# Patient Record
Sex: Female | Born: 2000 | Hispanic: No | Marital: Married | Smoking: Never smoker
Health system: Southern US, Community
[De-identification: ages and names within clinical notes are randomized; demographics above are authoritative.]

## PROBLEM LIST (undated history)

## (undated) ENCOUNTER — Emergency Department (HOSPITAL_COMMUNITY): Admission: EM | Source: Home / Self Care

## (undated) DIAGNOSIS — O24419 Gestational diabetes mellitus in pregnancy, unspecified control: Secondary | ICD-10-CM

## (undated) DIAGNOSIS — K219 Gastro-esophageal reflux disease without esophagitis: Secondary | ICD-10-CM

## (undated) HISTORY — PX: NO PAST SURGERIES: SHX2092

---
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
## 2022-08-29 ENCOUNTER — Ambulatory Visit
Admission: EM | Admit: 2022-08-29 | Discharge: 2022-08-29 | Disposition: A | Discharge status: Home / Self Care | Payer: Medicaid Other | Attending: Urgent Care | Admitting: Urgent Care

## 2022-08-29 DIAGNOSIS — G4700 Insomnia, unspecified: Secondary | ICD-10-CM | POA: Diagnosis not present

## 2022-08-29 DIAGNOSIS — G4452 New daily persistent headache (NDPH): Secondary | ICD-10-CM | POA: Diagnosis not present

## 2022-08-29 DIAGNOSIS — R413 Other amnesia: Secondary | ICD-10-CM | POA: Diagnosis not present

## 2022-08-29 MED ORDER — HYDROXYZINE HCL 25 MG PO TABS: 12.5000 mg | ORAL_TABLET | Freq: Every evening | ORAL | 0 refills | Status: DC | PRN

## 2022-08-29 MED ORDER — NAPROXEN 375 MG PO TABS: 375.0000 mg | ORAL_TABLET | Freq: Two times a day (BID) | ORAL | 0 refills | Status: DC

## 2022-08-29 MED ORDER — KETOROLAC TROMETHAMINE 30 MG/ML IJ SOLN
30.0000 mg | Freq: Once | INTRAMUSCULAR | Status: AC
  Administered 2022-08-29: 30 mg via INTRAMUSCULAR

## 2022-08-29 NOTE — ED Provider Notes (Signed)
[facility]  Note:  This document was prepared using Systems analyst and may include unintentional dictation errors.  MRN: [id number] DOB: 12/17/2000  Subjective:   Anne Ritter is a 22 y.o. female presenting for 1 week history of posterior right-sided occipital headache.  No head injury, confusion, weakness, double vision, numbness or tingling, neck pain, neck stiffness, runny or stuffy nose, sore throat, cough, nausea, vomiting, abdominal pain.  No history of migraines or headaches that required workup with neurology practice.  Patient reports that for the past year she has had significant difficulty with her sleep, has generally gotten 4, 5 or at the most 6 hours of sleep at night.  She is in school now.  Does not go to work.  Denies any other chronic conditions.  No drug use, alcohol use.  No smoking.  No current facility-administered medications for this encounter.  Current Outpatient Medications:    albuterol (PROVENTIL HFA;VENTOLIN HFA) 108 (90 Base) MCG/ACT inhaler, Inhale 1-2 puffs into the lungs every 6 (six) hours as needed for wheezing or shortness of breath., Disp: 1 Inhaler, Rfl: 0   famotidine (PEPCID) 40 MG tablet, Take 0.5 tablets (20 mg total) by mouth 2 (two) times daily. (Patient not taking: Reported on 03/10/2016), Disp: 30 tablet, Rfl: 0   No Known Allergies  Past Medical History:  Diagnosis Date   GERD (gastroesophageal reflux disease)      History reviewed. No pertinent surgical history.  No family history on file.  Social History   Tobacco Use   Smoking status: Never   Smokeless tobacco: Never  Vaping Use   Vaping Use: Never used  Substance Use Topics   Alcohol use: No   Drug use: Never    ROS   Objective:   Vitals: BP 116/77 (BP Location: Right Arm)   Pulse 82   Temp 97.9 F (36.6 C) (Oral)   Resp 16   LMP 08/16/2022   SpO2 98%   Physical Exam Constitutional:      General: She is not in acute  distress.    Appearance: Normal appearance. She is well-developed and normal weight. She is not ill-appearing, toxic-appearing or diaphoretic.  HENT:     Head: Normocephalic and atraumatic.     Right Ear: Tympanic membrane, ear canal and external ear normal. No drainage or tenderness. No middle ear effusion. There is no impacted cerumen. Tympanic membrane is not erythematous or bulging.     Left Ear: Tympanic membrane, ear canal and external ear normal. No drainage or tenderness.  No middle ear effusion. There is no impacted cerumen. Tympanic membrane is not erythematous or bulging.     Nose: Nose normal. No congestion or rhinorrhea.     Mouth/Throat:     Mouth: Mucous membranes are moist. No oral lesions.     Pharynx: No pharyngeal swelling, oropharyngeal exudate, posterior oropharyngeal erythema or uvula swelling.     Tonsils: No tonsillar exudate or tonsillar abscesses.  Eyes:     General: No scleral icterus.       Right eye: No discharge.        Left eye: No discharge.     Extraocular Movements: Extraocular movements intact.     Right eye: Normal extraocular motion.     Left eye: Normal extraocular motion.     Conjunctiva/sclera: Conjunctivae normal.     Pupils: Pupils are equal, round, and reactive to light. Pupils are equal.  Neck:     Meningeal: Brudzinski's sign  and Kernig's sign absent.  Cardiovascular:     Rate and Rhythm: Normal rate.  Pulmonary:     Effort: Pulmonary effort is normal.  Musculoskeletal:     Cervical back: Normal range of motion and neck supple.  Lymphadenopathy:     Cervical: No cervical adenopathy.  Skin:    General: Skin is warm and dry.  Neurological:     General: No focal deficit present.     Mental Status: She is alert and oriented to person, place, and time.     Cranial Nerves: No cranial nerve deficit, dysarthria or facial asymmetry.     Motor: No weakness or pronator drift.     Coordination: Romberg sign negative. Coordination normal.  Finger-Nose-Finger Test and Heel to [facility] Test normal. Rapid alternating movements normal.     Gait: Gait and tandem walk normal.     Deep Tendon Reflexes: Reflexes normal.  Psychiatric:        Mood and Affect: Mood normal. Mood is not anxious or depressed.        Speech: Speech normal.        Behavior: Behavior normal. Behavior is not agitated.        Thought Content: Thought content normal.        Cognition and Memory: Cognition is not impaired. Memory is not impaired.        Judgment: Judgment normal.     Comments: Flat affect.     Assessment and Plan :   PDMP not reviewed this encounter.  1. New daily persistent headache   2. Memory difficulties   3. Insomnia, unspecified type     No signs of an acute encephalopathy.  Suspect that insomnia is the primary problem.  Recommended supportive care for now.  Will pursue basic lab test.  Provided her with information to [facility] urology that she wants to pursue further workup as she reports memory difficulties for the past few months.  Counseled patient on potential for adverse effects with medications prescribed/recommended today, ER and return-to-clinic precautions discussed, patient verbalized understanding.    Jaynee Eagles, PA-C 08/29/22 1236

## 2022-08-29 NOTE — ED Triage Notes (Signed)
Pt c/o HA x 1 week-denies dx migraine-taking tylenol with some relief-NAD-steady gait

## 2022-08-30 LAB — CBC
Hematocrit: 41.6 % (ref 34.0–46.6)
Hemoglobin: 13.1 g/dL (ref 11.1–15.9)
MCH: 28.1 pg (ref 26.6–33.0)
MCHC: 31.5 g/dL (ref 31.5–35.7)
MCV: 89 fL (ref 79–97)
Platelets: 423 10*3/uL (ref 150–450)
RBC: 4.67 x10E6/uL (ref 3.77–5.28)
RDW: 15 % (ref 11.7–15.4)
WBC: 5 10*3/uL (ref 3.4–10.8)

## 2022-08-30 LAB — BASIC METABOLIC PANEL
BUN/Creatinine Ratio: 13 (ref 9–23)
BUN: 9 mg/dL (ref 6–20)
CO2: 20 mmol/L (ref 20–29)
Calcium: 9.2 mg/dL (ref 8.7–10.2)
Chloride: 106 mmol/L (ref 96–106)
Creatinine, Ser: 0.7 mg/dL (ref 0.57–1.00)
Glucose: 88 mg/dL (ref 70–99)
Potassium: 4.6 mmol/L (ref 3.5–5.2)
Sodium: 140 mmol/L (ref 134–144)
eGFR: 127 mL/min/{1.73_m2} (ref >59)

## 2022-08-30 LAB — TSH: TSH: 1 u[IU]/mL (ref 0.450–4.500)

## 2022-09-28 ENCOUNTER — Ambulatory Visit
Admission: EM | Admit: 2022-09-28 | Discharge: 2022-09-28 | Disposition: A | Discharge status: Home / Self Care | Payer: Medicaid Other | Attending: Nurse Practitioner | Admitting: Nurse Practitioner

## 2022-09-28 ENCOUNTER — Ambulatory Visit (INDEPENDENT_AMBULATORY_CARE_PROVIDER_SITE_OTHER): Payer: Medicaid Other

## 2022-09-28 DIAGNOSIS — R051 Acute cough: Secondary | ICD-10-CM

## 2022-09-28 DIAGNOSIS — B9689 Other specified bacterial agents as the cause of diseases classified elsewhere: Secondary | ICD-10-CM

## 2022-09-28 DIAGNOSIS — J208 Acute bronchitis due to other specified organisms: Secondary | ICD-10-CM | POA: Diagnosis not present

## 2022-09-28 MED ORDER — AMOXICILLIN-POT CLAVULANATE 875-125 MG PO TABS: 1.0000 | ORAL_TABLET | Freq: Two times a day (BID) | ORAL | 0 refills | Status: DC

## 2022-09-28 MED ORDER — PROMETHAZINE-DM 6.25-15 MG/5ML PO SYRP: 5.0000 mL | ORAL_SOLUTION | Freq: Four times a day (QID) | ORAL | 0 refills | Status: DC | PRN

## 2022-09-28 NOTE — ED Provider Notes (Signed)
[facility]    CSN: [id number] Arrival date & time: 09/28/22  1419      History   Chief Complaint Chief Complaint  Patient presents with   Chest Pain    HPI Anne Ritter is a 22 y.o. female  presents for evaluation of URI symptoms for 10 days. Patient reports associated symptoms of dry cough, congestion, chest pain with coughing, intermittent dizziness, chills. Denies N/V/D, sore throat, fevers, shortness of breath, ear pain, body aches. Patient does not have a hx of asthma or smoking. No known sick contacts but patient works as a Pharmacist, hospital.  She states this feels like when she had pneumonia in the past.  Pt has taken Flonase, DayQuil, Tylenol OTC for symptoms. Pt has no other concerns at this time.    Chest Pain Associated symptoms: cough and dizziness     Past Medical History:  Diagnosis Date   GERD (gastroesophageal reflux disease)     There are no problems to display for this patient.   History reviewed. No pertinent surgical history.  OB History   No obstetric history on file.      Home Medications    Prior to Admission medications   Medication Sig Start Date End Date Taking? Authorizing Provider  amoxicillin-clavulanate (AUGMENTIN) 875-125 MG tablet Take 1 tablet by mouth every 12 (twelve) hours. 09/28/22  Yes Melynda Ripple, NP  promethazine-dextromethorphan (PROMETHAZINE-DM) 6.25-15 MG/5ML syrup Take 5 mLs by mouth 4 (four) times daily as needed for cough. 09/28/22  Yes Melynda Ripple, NP  albuterol (PROVENTIL HFA;VENTOLIN HFA) 108 (90 Base) MCG/ACT inhaler Inhale 1-2 puffs into the lungs every 6 (six) hours as needed for wheezing or shortness of breath. 03/24/18   Valarie Merino, MD  famotidine (PEPCID) 40 MG tablet Take 0.5 tablets (20 mg total) by mouth 2 (two) times daily. Patient not taking: Reported on 03/10/2016 09/15/15   Nona Dell, PA-C  hydrOXYzine (ATARAX) 25 MG tablet Take 0.5-1 tablets (12.5-25 mg total) by mouth at bedtime as  needed (insomnia). 08/29/22   Jaynee Eagles, PA-C  naproxen (NAPROSYN) 375 MG tablet Take 1 tablet (375 mg total) by mouth 2 (two) times daily with a meal. 08/29/22   Jaynee Eagles, PA-C    Family History History reviewed. No pertinent family history.  Social History Social History   Tobacco Use   Smoking status: Never   Smokeless tobacco: Never  Vaping Use   Vaping Use: Never used  Substance Use Topics   Alcohol use: No   Drug use: Never     Allergies   Patient has no known allergies.   Review of Systems Review of Systems  Constitutional:  Positive for chills.  HENT:  Positive for congestion.   Respiratory:  Positive for cough.        Chest pain with coughing  Neurological:  Positive for dizziness.     Physical Exam Triage Vital Signs ED Triage Vitals  Enc Vitals Group     BP 09/28/22 1438 110/75     Pulse Rate 09/28/22 1438 88     Resp 09/28/22 1438 18     Temp 09/28/22 1438 98.3 F (36.8 C)     Temp Source 09/28/22 1438 Oral     SpO2 09/28/22 1438 97 %     Weight --      Height --      Head Circumference --      Peak Flow --      Pain Score 09/28/22 1437  9     Pain Loc --      Pain Edu? --      Excl. in [place]? --    No data found.  Updated Vital Signs BP 110/75 (BP Location: Right Arm)   Pulse 88   Temp 98.3 F (36.8 C) (Oral)   Resp 18   LMP 08/19/2022 (Exact Date)   SpO2 97%   Visual Acuity Right Eye Distance:   Left Eye Distance:   Bilateral Distance:    Right Eye Near:   Left Eye Near:    Bilateral Near:     Physical Exam Vitals and nursing note reviewed.  Constitutional:      General: She is not in acute distress.    Appearance: She is well-developed. She is not ill-appearing.  HENT:     Head: Normocephalic and atraumatic.     Right Ear: Tympanic membrane and ear canal normal.     Left Ear: Tympanic membrane and ear canal normal.     Nose: Congestion present.     Mouth/Throat:     Mouth: Mucous membranes are moist.     Pharynx:  Oropharynx is clear. Uvula midline. No oropharyngeal exudate or posterior oropharyngeal erythema.     Tonsils: No tonsillar exudate or tonsillar abscesses.  Eyes:     Conjunctiva/sclera: Conjunctivae normal.     Pupils: Pupils are equal, round, and reactive to light.  Cardiovascular:     Rate and Rhythm: Normal rate and regular rhythm.     Heart sounds: Normal heart sounds.  Pulmonary:     Effort: Pulmonary effort is normal.     Breath sounds: Normal breath sounds.  Musculoskeletal:     Cervical back: Normal range of motion and neck supple.  Lymphadenopathy:     Cervical: No cervical adenopathy.  Skin:    General: Skin is warm and dry.  Neurological:     General: No focal deficit present.     Mental Status: She is alert and oriented to person, place, and time.  Psychiatric:        Mood and Affect: Mood normal.        Behavior: Behavior normal.      UC Treatments / Results  Labs (all labs ordered are listed, but only abnormal results are displayed) Labs Reviewed - No data to display  EKG   Radiology DG Chest 2 View  Result Date: 09/28/2022 CLINICAL DATA:  Cough for 1 week, chest pain, history of pneumonia EXAM: CHEST - 2 VIEW COMPARISON:  03/24/2018 FINDINGS: Normal heart size, mediastinal contours, and pulmonary vascularity. Mild peribronchial thickening. No pulmonary infiltrate, pleural effusion, or pneumothorax. Bones unremarkable. IMPRESSION: Mild chronic bronchitic changes without infiltrate. Electronically Signed   By: Lavonia Dana M.D.   On: 09/28/2022 15:06    Procedures Procedures (including critical care time)  Medications Ordered in UC Medications - No data to display  Initial Impression / Assessment and Plan / UC Course  I have reviewed the triage vital signs and the nursing notes.  Pertinent labs & imaging results that were available during my care of the patient were reviewed by me and considered in my medical decision making (see chart for details).      Reviewed symptoms and x-ray with patient.  No pneumonia. Start Augmentin given length of symptoms Promethazine DM as needed for cough.  Side effect profile reviewed Rest and fluids Continue OTC Tylenol or ibuprofen as needed Follow-up with PCP if symptoms do not improve ER precautions reviewed and  patient verbalized understanding Final Clinical Impressions(s) / UC Diagnoses   Final diagnoses:  Acute cough  Acute bacterial bronchitis     Discharge Instructions      Augmentin twice daily for 7 days Promethazine DM as needed for cough.  Please note this medication can make you drowsy.  Do not drink alcohol or drive on this medication Rest and fluids Continue over-the-counter Tylenol or ibuprofen as needed Follow-up with your PCP if your symptoms do not improve Please go to the emergency room for any worsening symptoms     ED Prescriptions     Medication Sig Dispense Auth. Provider   amoxicillin-clavulanate (AUGMENTIN) 875-125 MG tablet Take 1 tablet by mouth every 12 (twelve) hours. 14 tablet Melynda Ripple, NP   promethazine-dextromethorphan (PROMETHAZINE-DM) 6.25-15 MG/5ML syrup Take 5 mLs by mouth 4 (four) times daily as needed for cough. 118 mL Melynda Ripple, NP      PDMP not reviewed this encounter.   Melynda Ripple, NP 09/28/22 1517

## 2022-09-28 NOTE — Discharge Instructions (Signed)
Augmentin twice daily for 7 days Promethazine DM as needed for cough.  Please note this medication can make you drowsy.  Do not drink alcohol or drive on this medication Rest and fluids Continue over-the-counter Tylenol or ibuprofen as needed Follow-up with your PCP if your symptoms do not improve Please go to the emergency room for any worsening symptoms

## 2022-09-28 NOTE — ED Triage Notes (Addendum)
Pt states Cold sxs started on Monday last week and then Thursday c/o pain in throat, cough, cold sxs and HA on Monday. Starting yesterday with c/o burning pain in middle of chest, hard to breath, dizziness/lightheaded yesterday at work. No F/chills. Hx of PNA. Home remedies: Dayquil, Tylenol, Fluticasone

## 2022-10-06 ENCOUNTER — Ambulatory Visit
Admission: EM | Admit: 2022-10-06 | Discharge: 2022-10-06 | Disposition: A | Discharge status: Home / Self Care | Payer: Medicaid Other | Attending: Physician Assistant | Admitting: Physician Assistant

## 2022-10-06 DIAGNOSIS — B3731 Acute candidiasis of vulva and vagina: Secondary | ICD-10-CM | POA: Insufficient documentation

## 2022-10-06 MED ORDER — FLUCONAZOLE 150 MG PO TABS: 150.0000 mg | ORAL_TABLET | Freq: Every day | ORAL | 1 refills | Status: DC

## 2022-10-06 NOTE — ED Triage Notes (Signed)
Pt presents with c/o vaginal irritation X 3 days.  States she wants to make sure she also does not have bronchitis anymore.

## 2022-10-06 NOTE — ED Provider Notes (Signed)
[facility]    CSN: [id number] Arrival date & time: 10/06/22  1229      History   Chief Complaint Chief Complaint  Patient presents with   vaginal irritation    HPI Anne Ritter is a 22 y.o. female.   Patient complains of a vaginal discharge.  Patient just finished Augmentin for a infection.  Patient is concerned that she has a yeast infection.  Patient denies any STD risk patient has not had any urinary symptoms.  Patient reports that she has a cough.  Patient denies any difficulty breathing she states that she feels better after finishing the antibiotic  The history is provided by the patient. No language interpreter was used.    Past Medical History:  Diagnosis Date   GERD (gastroesophageal reflux disease)     There are no problems to display for this patient.   History reviewed. No pertinent surgical history.  OB History   No obstetric history on file.      Home Medications    Prior to Admission medications   Medication Sig Start Date End Date Taking? Authorizing Provider  fluconazole (DIFLUCAN) 150 MG tablet Take 1 tablet (150 mg total) by mouth daily. 10/06/22  Yes Caryl Ada K, PA-C  albuterol (PROVENTIL HFA;VENTOLIN HFA) 108 (90 Base) MCG/ACT inhaler Inhale 1-2 puffs into the lungs every 6 (six) hours as needed for wheezing or shortness of breath. 03/24/18   Valarie Merino, MD  amoxicillin-clavulanate (AUGMENTIN) 875-125 MG tablet Take 1 tablet by mouth every 12 (twelve) hours. 09/28/22   Melynda Ripple, NP  famotidine (PEPCID) 40 MG tablet Take 0.5 tablets (20 mg total) by mouth 2 (two) times daily. Patient not taking: Reported on 03/10/2016 09/15/15   Nona Dell, PA-C  hydrOXYzine (ATARAX) 25 MG tablet Take 0.5-1 tablets (12.5-25 mg total) by mouth at bedtime as needed (insomnia). 08/29/22   Jaynee Eagles, PA-C  naproxen (NAPROSYN) 375 MG tablet Take 1 tablet (375 mg total) by mouth 2 (two) times daily with a meal. 08/29/22   Jaynee Eagles,  PA-C  promethazine-dextromethorphan (PROMETHAZINE-DM) 6.25-15 MG/5ML syrup Take 5 mLs by mouth 4 (four) times daily as needed for cough. 09/28/22   Melynda Ripple, NP    Family History History reviewed. No pertinent family history.  Social History Social History   Tobacco Use   Smoking status: Never   Smokeless tobacco: Never  Vaping Use   Vaping Use: Never used  Substance Use Topics   Alcohol use: No   Drug use: Never     Allergies   Patient has no known allergies.   Review of Systems Review of Systems  All other systems reviewed and are negative.    Physical Exam Triage Vital Signs ED Triage Vitals [10/06/22 1255]  Enc Vitals Group     BP 110/78     Pulse Rate 85     Resp 18     Temp 98.2 F (36.8 C)     Temp Source Oral     SpO2 98 %     Weight      Height      Head Circumference      Peak Flow      Pain Score 5     Pain Loc      Pain Edu?      Excl. in [place]?    No data found.  Updated Vital Signs BP 110/78 (BP Location: Right Arm)   Pulse 85   Temp 98.2  F (36.8 C) (Oral)   Resp 18   LMP 09/17/2022 (Exact Date)   SpO2 98%   Visual Acuity Right Eye Distance:   Left Eye Distance:   Bilateral Distance:    Right Eye Near:   Left Eye Near:    Bilateral Near:     Physical Exam Vitals reviewed.  Constitutional:      Appearance: Normal appearance.  HENT:     Mouth/Throat:     Mouth: Mucous membranes are moist.  Cardiovascular:     Rate and Rhythm: Normal rate and regular rhythm.  Pulmonary:     Effort: Pulmonary effort is normal.  Abdominal:     General: Abdomen is flat.  Musculoskeletal:        General: Normal range of motion.     Cervical back: Normal range of motion.  Skin:    General: Skin is warm.  Neurological:     General: No focal deficit present.     Mental Status: She is alert.  Psychiatric:        Mood and Affect: Mood normal.      UC Treatments / Results  Labs (all labs ordered are listed, but only abnormal  results are displayed) Labs Reviewed  CERVICOVAGINAL ANCILLARY ONLY    EKG   Radiology No results found.  Procedures Procedures (including critical care time)  Medications Ordered in UC Medications - No data to display  Initial Impression / Assessment and Plan / UC Course  I have reviewed the triage vital signs and the nursing notes.  Pertinent labs & imaging results that were available during my care of the patient were reviewed by me and considered in my medical decision making (see chart for details).     Patient has vaginal discharge after taking Augmentin for a week I suspect she has a yeast infection patient is given a prescription for Diflucan Final Clinical Impressions(s) / UC Diagnoses   Final diagnoses:  None   Discharge Instructions   None    ED Prescriptions     Medication Sig Dispense Auth. Provider   fluconazole (DIFLUCAN) 150 MG tablet Take 1 tablet (150 mg total) by mouth daily. 1 tablet Fransico Meadow, [place]      An After Visit Summary was printed and given to the patient.  PDMP not reviewed this encounter.   Fransico Meadow, [place] 10/06/22 1338

## 2022-10-08 ENCOUNTER — Telehealth: Payer: Self-pay

## 2022-10-08 NOTE — Telephone Encounter (Signed)
Patient verification complete (name and date of birth).  Patient requesting to know resent cytology results. Patient made aware that the results are still pending. All questions answered.

## 2022-10-09 ENCOUNTER — Telehealth: Payer: Self-pay | Admitting: Emergency Medicine

## 2022-10-09 LAB — CERVICOVAGINAL ANCILLARY ONLY
Bacterial Vaginitis (gardnerella): POSITIVE — AB
Candida Glabrata: NEGATIVE
Candida Vaginitis: NEGATIVE
Chlamydia: NEGATIVE
Comment: NEGATIVE
Comment: NEGATIVE
Comment: NEGATIVE
Comment: NEGATIVE
Comment: NEGATIVE
Comment: NORMAL
Neisseria Gonorrhea: NEGATIVE
Trichomonas: NEGATIVE

## 2022-10-09 MED ORDER — METRONIDAZOLE 500 MG PO TABS: 500.0000 mg | ORAL_TABLET | Freq: Two times a day (BID) | ORAL | 0 refills | Status: DC

## 2022-10-22 ENCOUNTER — Telehealth: Payer: Self-pay | Admitting: *Deleted

## 2022-10-22 NOTE — Telephone Encounter (Signed)
Pt reports that another facility had prescribed her oral contraceptives, and she was told to start them on the first Sunday following the start date of her menstrual period; pt states she started her period this past week "but it was very short" compared to normal and is wondering if she should still start the oral contraceptives today. Informed pt that we cannot provide advice to treatment we haven't prescribed, and recommended that pt contact prescriber. Pt verbalized understanding.

## 2022-11-09 ENCOUNTER — Ambulatory Visit
Admission: EM | Admit: 2022-11-09 | Discharge: 2022-11-09 | Disposition: A | Discharge status: Home / Self Care | Payer: Medicaid Other | Attending: Nurse Practitioner | Admitting: Nurse Practitioner

## 2022-11-09 DIAGNOSIS — R21 Rash and other nonspecific skin eruption: Secondary | ICD-10-CM | POA: Diagnosis not present

## 2022-11-09 MED ORDER — TRIAMCINOLONE ACETONIDE 0.1 % EX CREA: 1.0000 | TOPICAL_CREAM | Freq: Two times a day (BID) | CUTANEOUS | 0 refills | Status: DC

## 2022-11-09 NOTE — ED Provider Notes (Signed)
[facility]    CSN: [id number] Arrival date & time: 11/09/22  1254      History   Chief Complaint Chief Complaint  Patient presents with   Rash    HPI Anne Ritter is a 22 y.o. female presents for evaluation of a rash.  Patient reports since February she has had progressively worsening mildly pruritic rash on the inner aspects of both upper arms as well as on her inner thighs.  She states it feels tender to touch but otherwise does not cause her any pain.  Denies any swelling, drainage, fevers, chills.  No new contacts including soaps, medications, pets, etc.  She states she has had eczema in the past on her face only but this is not consistent with her typical eczema.  She states a reasonable stretch marks so she tried an over-the-counter stretch mark cream and feels like it made it worse.  She denies any weight gain since onset of symptoms.  No other concerns at this time.   Rash   Past Medical History:  Diagnosis Date   GERD (gastroesophageal reflux disease)     There are no problems to display for this patient.   History reviewed. No pertinent surgical history.  OB History   No obstetric history on file.      Home Medications    Prior to Admission medications   Medication Sig Start Date End Date Taking? Authorizing Provider  triamcinolone cream (KENALOG) 0.1 % Apply 1 Application topically 2 (two) times daily. 11/09/22  Yes Radford Pax, NP  albuterol (PROVENTIL HFA;VENTOLIN HFA) 108 (90 Base) MCG/ACT inhaler Inhale 1-2 puffs into the lungs every 6 (six) hours as needed for wheezing or shortness of breath. 03/24/18   Wynetta Fines, MD    Family History History reviewed. No pertinent family history.  Social History Social History   Tobacco Use   Smoking status: Never   Smokeless tobacco: Never  Vaping Use   Vaping Use: Never used  Substance Use Topics   Alcohol use: No   Drug use: Never     Allergies   Patient has no known  allergies.   Review of Systems Review of Systems  Skin:  Positive for rash.     Physical Exam Triage Vital Signs ED Triage Vitals  Enc Vitals Group     BP 11/09/22 1315 115/79     Pulse Rate 11/09/22 1315 93     Resp 11/09/22 1315 18     Temp 11/09/22 1315 98.8 F (37.1 C)     Temp Source 11/09/22 1315 Oral     SpO2 11/09/22 1315 99 %     Weight --      Height --      Head Circumference --      Peak Flow --      Pain Score 11/09/22 1316 0     Pain Loc --      Pain Edu? --      Excl. in GC? --    No data found.  Updated Vital Signs BP 115/79 (BP Location: Left Arm)   Pulse 93   Temp 98.8 F (37.1 C) (Oral)   Resp 18   LMP 10/17/2022   SpO2 99%   Visual Acuity Right Eye Distance:   Left Eye Distance:   Bilateral Distance:    Right Eye Near:   Left Eye Near:    Bilateral Near:     Physical Exam Vitals and nursing note reviewed.  Constitutional:  General: She is not in acute distress.    Appearance: Normal appearance. She is not ill-appearing.  HENT:     Head: Normocephalic and atraumatic.  Eyes:     Pupils: Pupils are equal, round, and reactive to light.  Cardiovascular:     Rate and Rhythm: Normal rate.  Pulmonary:     Effort: Pulmonary effort is normal.  Skin:    General: Skin is warm and dry.     Comments: Mildly erythematic striations noted on the inner aspects of bilateral upper arms and inner thighs.  No swelling drainage vesicles lesions.  Mildly tender to palpation.  No warmth.  Please see photo  Neurological:     General: No focal deficit present.     Mental Status: She is alert and oriented to person, place, and time.  Psychiatric:        Mood and Affect: Mood normal.        Behavior: Behavior normal.      UC Treatments / Results  Labs (all labs ordered are listed, but only abnormal results are displayed) Labs Reviewed - No data to display Recent Results (from the past 2160 hour(s))  TSH     Status: None   Collection Time:  08/29/22 11:50 AM  Result Value Ref Range   TSH 1.000 0.450 - 4.500 uIU/mL  Basic metabolic panel     Status: None   Collection Time: 08/29/22 11:50 AM  Result Value Ref Range   Glucose 88 70 - 99 mg/dL   BUN 9 6 - 20 mg/dL   Creatinine, Ser 1.30 0.57 - 1.00 mg/dL   eGFR 865 >78 IO/NGE/9.52   BUN/Creatinine Ratio 13 9 - 23   Sodium 140 134 - 144 mmol/L   Potassium 4.6 3.5 - 5.2 mmol/L   Chloride 106 96 - 106 mmol/L   CO2 20 20 - 29 mmol/L   Calcium 9.2 8.7 - 10.2 mg/dL  CBC     Status: None   Collection Time: 08/29/22 11:50 AM  Result Value Ref Range   WBC 5.0 3.4 - 10.8 x10E3/uL   RBC 4.67 3.77 - 5.28 x10E6/uL   Hemoglobin 13.1 11.1 - 15.9 g/dL   Hematocrit 84.1 32.4 - 46.6 %   MCV 89 79 - 97 fL   MCH 28.1 26.6 - 33.0 pg   MCHC 31.5 31.5 - 35.7 g/dL   RDW 40.1 02.7 - 25.3 %   Platelets 423 150 - 450 x10E3/uL  Cervicovaginal ancillary only     Status: Abnormal   Collection Time: 10/06/22  1:28 PM  Result Value Ref Range   Bacterial Vaginitis (gardnerella) Positive (A)    Chlamydia Negative    Neisseria Gonorrhea Negative    Candida Vaginitis Negative    Candida Glabrata Negative    Trichomonas Negative    Comment Normal Reference Ranger Chlamydia - Negative    Comment      Normal Reference Range Bacterial Vaginosis - Negative   Comment      Normal Reference Range Neisseria Gonorrhea - Negative   Comment Normal Reference Range Candida Species - Negative    Comment Normal Reference Range Candida Galbrata - Negative    Comment Normal Reference Range Trichomonas - Negative      EKG   Radiology No results found.  Procedures Procedures (including critical care time)  Medications Ordered in UC Medications - No data to display  Initial Impression / Assessment and Plan / UC Course  I have reviewed the triage vital signs  and the nursing notes.  Pertinent labs & imaging results that were available during my care of the patient were reviewed by me and considered in  my medical decision making (see chart for details).    Reviewed recent provider visits and labs.  Unclear cause of rash.  Does not appear infectious but not typical for dermatitis Will do trial of Kenalog cream and referred to dermatology for further evaluation Patient should follow-up with PCP if symptoms do not improve ER precautions reviewed and patient verbalized understanding Final Clinical Impressions(s) / UC Diagnoses   Final diagnoses:  Rash and nonspecific skin eruption     Discharge Instructions      Will do trial of topical steroid cream Kenalog to the rash areas twice daily as needed Please follow-up with dermatology for further evaluation of your symptoms   ED Prescriptions     Medication Sig Dispense Auth. Provider   triamcinolone cream (KENALOG) 0.1 % Apply 1 Application topically 2 (two) times daily. 30 g Radford Pax, NP      PDMP not reviewed this encounter.   Radford Pax, NP 11/09/22 1331

## 2022-11-09 NOTE — Discharge Instructions (Signed)
Will do trial of topical steroid cream Kenalog to the rash areas twice daily as needed Please follow-up with dermatology for further evaluation of your symptoms

## 2022-11-09 NOTE — ED Triage Notes (Signed)
Pt c/o a red burning rash to bilateral upper anterior arms and thighs since February. States looks like stretch marks but hasn't had any weight change and used cream that made it was.

## 2022-11-15 ENCOUNTER — Ambulatory Visit (INDEPENDENT_AMBULATORY_CARE_PROVIDER_SITE_OTHER): Payer: Medicaid Other | Admitting: Dermatology

## 2022-11-15 ENCOUNTER — Encounter: Payer: Self-pay | Admitting: Dermatology

## 2022-11-15 DIAGNOSIS — L299 Pruritus, unspecified: Secondary | ICD-10-CM | POA: Diagnosis not present

## 2022-11-15 DIAGNOSIS — L906 Striae atrophicae: Secondary | ICD-10-CM

## 2022-11-15 MED ORDER — TRETINOIN 0.025 % EX CREA: TOPICAL_CREAM | Freq: Every day | CUTANEOUS | 0 refills | Status: DC

## 2022-11-15 MED ORDER — TACROLIMUS 0.1 % EX OINT: TOPICAL_OINTMENT | Freq: Two times a day (BID) | CUTANEOUS | 0 refills | Status: DC

## 2022-11-15 NOTE — Progress Notes (Unsigned)
   New Patient Visit   Subjective  Anne Ritter is a 22 y.o. female who presents for the following: Rash  Patient has a rash on the medial arms and thighs. It has been present since February. It has a burning sensation especially before bed and showering. Has tried Triamcinolone given by Urgent Care. She has been using it for about a week with relieve in redness but not the burning sensation. Not using any other products. Uses regular sensitive bar soap and moisturized with Vaseline and EOS lotion.    The following portions of the chart were reviewed this encounter and updated as appropriate: medications, allergies, medical history  Review of Systems:  No other skin or systemic complaints except as noted in HPI or Assessment and Plan.  Objective  Well appearing patient in no apparent distress; mood and affect are within normal limits.  A focused examination was performed of the following areas: Arms and Legs   Relevant exam findings are noted in the Assessment and Plan.    Assessment & Plan  Striae Rubra  Exam: Red, itchy marks due to stretching on the skin  Treatment Plan: -Tretinoin 0.025% to use at night. Apply a pea sized amount of prescription mixed with lotion. Apply to affected areas.    Puritis Exam: Irritated itchy skin  Treatment Plan: -Tacrolimus to use twice a day for two weeks    Return in about 4 months (around 03/18/2023) for Striae follow up.    Documentation: I have reviewed the above documentation for accuracy and completeness, and I agree with the above.  Langston Reusing, MD  I, Germaine Pomfret, CMA, am acting as scribe for Langston Reusing, MD.

## 2022-11-15 NOTE — Patient Instructions (Addendum)
Due to recent changes in healthcare laws, you may see results of your pathology and/or laboratory studies on MyChart before the doctors have had a chance to review them. We understand that in some cases there may be results that are confusing or concerning to you. Please understand that not all results are received at the same time and often the doctors may need to interpret multiple results in order to provide you with the best plan of care or course of treatment. Therefore, we ask that you please give us 2 business days to thoroughly review all your results before contacting the office for clarification. Should we see a critical lab result, you will be contacted sooner.   If You Need Anything After Your Visit  If you have any questions or concerns for your doctor, please call our main line at [phone] If no one answers, please leave a voicemail as directed and we will return your call as soon as possible. Messages left after 4 pm will be answered the following business day.   You may also send us a message via MyChart. We typically respond to MyChart messages within 1-2 business days.  For prescription refills, please ask your pharmacy to contact our office. Our fax number is [phone].  If you have an urgent issue when the clinic is closed that cannot wait until the next business day, you can page your doctor at the number below.    Please note that while we do our best to be available for urgent issues outside of office hours, we are not available 24/7.   If you have an urgent issue and are unable to reach us, you may choose to seek medical care at your doctor's office, retail clinic, urgent care center, or emergency room.  If you have a medical emergency, please immediately call 911 or go to the emergency department. In the event of inclement weather, please call our main line at [phone] for an update on the status of any delays or closures.  Dermatology Medication Tips: Please  keep the boxes that topical medications come in in order to help keep track of the instructions about where and how to use these. Pharmacies typically print the medication instructions only on the boxes and not directly on the medication tubes.   If your medication is too expensive, please contact our office at [phone] or send us a message through MyChart.   We are unable to tell what your co-pay for medications will be in advance as this is different depending on your insurance coverage. However, we may be able to find a substitute medication at lower cost or fill out paperwork to get insurance to cover a needed medication.   If a prior authorization is required to get your medication covered by your insurance company, please allow us 1-2 business days to complete this process.  Drug prices often vary depending on where the prescription is filled and some pharmacies may offer cheaper prices.  The website www.goodrx.com contains coupons for medications through different pharmacies. The prices here do not account for what the cost may be with help from insurance (it may be cheaper with your insurance), but the website can give you the price if you did not use any insurance.  - You can print the associated coupon and take it with your prescription to the pharmacy.  - You may also stop by our office during regular business hours and pick up a GoodRx coupon card.  - If you need your   prescription sent electronically to a different pharmacy, notify our office through [facility] MyChart or by phone at [phone]     

## 2022-11-16 ENCOUNTER — Other Ambulatory Visit: Payer: Self-pay

## 2022-11-16 MED ORDER — TRETINOIN 0.025 % EX CREA: TOPICAL_CREAM | Freq: Every day | CUTANEOUS | 0 refills | Status: DC

## 2023-05-21 ENCOUNTER — Emergency Department (HOSPITAL_BASED_OUTPATIENT_CLINIC_OR_DEPARTMENT_OTHER)
Admission: EM | Admit: 2023-05-21 | Discharge: 2023-05-21 | Disposition: A | Discharge status: Home / Self Care | Payer: Medicaid Other | Attending: Emergency Medicine | Admitting: Emergency Medicine

## 2023-05-21 ENCOUNTER — Other Ambulatory Visit: Payer: Self-pay

## 2023-05-21 ENCOUNTER — Encounter (HOSPITAL_BASED_OUTPATIENT_CLINIC_OR_DEPARTMENT_OTHER): Payer: Self-pay | Admitting: Pediatrics

## 2023-05-21 DIAGNOSIS — O26891 Other specified pregnancy related conditions, first trimester: Secondary | ICD-10-CM | POA: Insufficient documentation

## 2023-05-21 DIAGNOSIS — R202 Paresthesia of skin: Secondary | ICD-10-CM | POA: Insufficient documentation

## 2023-05-21 DIAGNOSIS — O99351 Diseases of the nervous system complicating pregnancy, first trimester: Secondary | ICD-10-CM | POA: Diagnosis present

## 2023-05-21 DIAGNOSIS — Z3A14 14 weeks gestation of pregnancy: Secondary | ICD-10-CM | POA: Insufficient documentation

## 2023-05-21 NOTE — ED Provider Notes (Signed)
[facility] Provider Note   CSN: [id number] Arrival date & time: 05/21/23  1035     History  Chief Complaint  Patient presents with   Bleeding/Bruising    Anne Ritter is a 22 y.o. female who is [redacted] weeks pregnant and presents emergency department for evaluation of bruising on her right AC surface along with numbness and paresthesias of her entire arm last night.  Patient reported having a blood draw on 10/29 where she experienced pain during the blood draw and noticed swelling and bruising that persisted afterwards.  She tried using warm compresses which did not help.  Patient reports improvement in the swelling and bruising.  Patient denies history of bleeding disorders.  She stated that last night while she was sleeping she woke up and her entire right arm was numb with paresthesias.  She stated that this kept her awake throughout the night.  She denied facial numbness, weakness of RUE, or dizziness.  She stated that when she woke up this morning her symptoms completely resolved.     Home Medications Prior to Admission medications   Medication Sig Start Date End Date Taking? Authorizing Provider  albuterol (PROVENTIL HFA;VENTOLIN HFA) 108 (90 Base) MCG/ACT inhaler Inhale 1-2 puffs into the lungs every 6 (six) hours as needed for wheezing or shortness of breath. 03/24/18   Wynetta Fines, MD  tacrolimus (PROTOPIC) 0.1 % ointment Apply topically 2 (two) times daily. Apply topically to the affected areas twice a day for two weeks then as needed for itch 11/15/22   Terri Piedra, DO  tretinoin (RETIN-A) 0.025 % cream Apply topically at bedtime. Mix a pea sized amount with quarter size amount of moisturizer. Apply to affected areas every other night 11/16/22 11/16/23  Terri Piedra, DO  triamcinolone cream (KENALOG) 0.1 % Apply 1 Application topically 2 (two) times daily. 11/09/22   Radford Pax, NP      Allergies    Patient has no known  allergies.    Review of Systems   Review of Systems  Neurological:  Positive for numbness (Resolved). Negative for dizziness, weakness and headaches.  Hematological:  Does not bruise/bleed easily.    Physical Exam Updated Vital Signs BP 108/67   Pulse 88   Temp 97.9 F (36.6 C) (Oral)   Resp 15   Ht 5\' 4"  (1.626 m)   Wt 60.3 kg   LMP 02/10/2023   SpO2 100%   BMI 22.83 kg/m  Physical Exam Vitals and nursing note reviewed.  Constitutional:      General: She is not in acute distress.    Appearance: She is normal weight. She is not ill-appearing, toxic-appearing or diaphoretic.  Musculoskeletal:        General: Normal range of motion.     Right shoulder: No swelling. Normal range of motion.     Left shoulder: No swelling. Normal range of motion.     Comments: Right AC bruising  Skin:    General: Skin is warm and dry.     Findings: Bruising (Right AC) present.  Neurological:     General: No focal deficit present.     Mental Status: She is alert.     Cranial Nerves: No dysarthria or facial asymmetry.     Sensory: Sensation is intact.     Motor: Motor function is intact. No weakness.     ED Results / Procedures / Treatments   Labs (all labs ordered are listed, but  only abnormal results are displayed) Labs Reviewed - No data to display  EKG None  Radiology No results found.  Procedures Procedures    Medications Ordered in ED Medications - No data to display  ED Course/ Medical Decision Making/ A&P                                 Medical Decision Making Patient is a 22 year old female who is [redacted] weeks pregnant who presents to the emergency department for right AC bruising and right upper extremity numbness with paresthesias that have resolved. Physical exam was remarkable for right AC bruising without edema or tenderness.  Sensation was intact on the bilateral upper extremities, motor strength 5 out of 5 bilateral upper extremities, EOM intact, no facial droop  or dysarthria appreciated on exam.  Paresthesias most likely due to sleeping on her arm in a different position which would have caused numbness and paresthesias in a nonspecific dermatome distribution.  Other etiologies such as TIA is considered however she is low risk with the ABCD 2 Score. The right AC bruising is improving.    She was discharged home with resources to establish with a PCP.            Final Clinical Impression(s) / ED Diagnoses Final diagnoses:  Paresthesias    Rx / DC Orders ED Discharge Orders     None         Faith Rogue, DO 05/21/23 1233    Laurence Spates, MD 05/21/23 1943

## 2023-05-21 NOTE — ED Triage Notes (Signed)
States she is [redacted] weeks pregnant, G1P0; stated she had a glucose tolerance test on 10/29 and the bruising from venipuncture on left arm / AC area has a bruise that's getting bigger along with episode of numbness all night and improved upon awakening, stated initially, the site was swollen but it has improved.

## 2023-05-21 NOTE — ED Notes (Signed)
D/c paperwork reviewed with pt, including follow up care.  All questions and/or concerns addressed at time of d/c.  No further needs expressed. . Pt verbalized understanding, Ambulatory without assistance to ED exit, NAD.   

## 2023-05-21 NOTE — Discharge Instructions (Signed)
You came into the emergency department for concerns of bruising on the arm and numbness of your arm. The bruising is improving and the numbness has resolved, this could be due to sleeping on your arm. Please call to make an appointment with the PCP listed above.

## 2023-07-18 NOTE — L&D Delivery Note (Signed)
    Anne Ritter is a 23 y.o. G1P0 s/p VD at [redacted]w[redacted]d. She was admitted for SROM with active labor.   ROM: 8h 27m with clear fluid GBS Status: Negative Maximum Maternal Temperature: 98.2  Labor Progress: Patient presented for SROM and labored without need for augmentation.  Patient delivered as below with staff, mother, and doula support.   Delivery Date/Time: Saturday November 11, 2023 at 0345 Delivery: Called to room and patient was complete and pushing. After one hour of pushing the head delivered in LOA. No nuchal and arm cord present. Shoulder and body delivered in usual fashion. Infant with spontaneous cry, placed on mother's abdomen, dried and stimulated. Cord clamped x 2 after 1-minute delay, and cut by mother. Cord blood drawn. Placenta delivered spontaneously with gentle cord traction. Fundus firm with massage. Labia, perineum, vagina, and cervix inspected and revealed a 2nd degree perineal laceration that was repaired with 3-0 vicryl on CT-1.  10mL of 1% lidocaine utilized for repair and patient tolerated the procedure well. Fundus firm, at the umbilicus, and bleeding small.  Mother declines pitocin and reviewed risks and recommendation.   Patient verbalizes understanding and accepts risks. Patient hemodynamically stable and infant skin to skin prior to provider exit.  Mother desires no method for birth control and opts to breastfeed.   Placenta: Intact, Disposal Complications: None Lacerations: 2nd Degree Perineal EBL: 48 Analgesia: Local for repair  Postpartum Planning -Discharge Summary Shared -PP Message Sent  Infant: Female: Name Pending  APGARs 8, 9  2860g 6lbs 4.9oz 20in  Kraig Peru, CNM  11/11/2023 5:08 AM  Delivery Report: Review the Delivery Report for details.

## 2023-10-17 LAB — OB RESULTS CONSOLE GBS: GBS: NEGATIVE

## 2023-11-01 ENCOUNTER — Encounter: Payer: Self-pay | Admitting: Family Medicine

## 2023-11-01 ENCOUNTER — Ambulatory Visit: Admitting: Family Medicine

## 2023-11-01 VITALS — BP 110/70 | HR 105 | Wt 154.0 lb

## 2023-11-01 DIAGNOSIS — O365990 Maternal care for other known or suspected poor fetal growth, unspecified trimester, not applicable or unspecified: Secondary | ICD-10-CM

## 2023-11-01 DIAGNOSIS — O365931 Maternal care for other known or suspected poor fetal growth, third trimester, fetus 1: Secondary | ICD-10-CM

## 2023-11-01 DIAGNOSIS — O24419 Gestational diabetes mellitus in pregnancy, unspecified control: Secondary | ICD-10-CM | POA: Diagnosis not present

## 2023-11-01 DIAGNOSIS — Z3A37 37 weeks gestation of pregnancy: Secondary | ICD-10-CM | POA: Diagnosis not present

## 2023-11-01 DIAGNOSIS — O0990 Supervision of high risk pregnancy, unspecified, unspecified trimester: Secondary | ICD-10-CM

## 2023-11-02 ENCOUNTER — Encounter: Payer: Self-pay | Admitting: Family Medicine

## 2023-11-02 NOTE — Progress Notes (Signed)
 Subjective:  Shiah Berhow is a G1P0 [redacted]w[redacted]d being seen today for her first obstetrical visit.  Her obstetrical history is significant for  first pregnancy, late transfer of care to our office, and A1 GDM. She initially went to [facility] in [place]. She states that she had a normal pregnancy with her only complication as GDM, for which she is diet controlled. However, upon review of the record, her baby is also growth restricted with an AC of 6%. This is a change from her 30wk growth of an AC of 47%. She has been getting BPPs weekly which were normal. 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 She desired to transition to a different practice because she did not want to be induced and they felt that they were pushing her to be induced . Patient does intend to breast feed. Pregnancy history fully reviewed.  Patient reports no complaints.  BP 110/70   Pulse (!) 105   Wt 154 lb (69.9 kg)   LMP 02/10/2023   BMI 26.43 kg/m   HISTORY: OB History  Gravida Para Term Preterm AB Living  1       SAB IAB Ectopic Multiple Live Births          # Outcome Date GA Lbr Len/2nd Weight Sex Type Anes PTL Lv  1 Current             Past Medical History:  Diagnosis Date   GERD (gastroesophageal reflux disease)     No past surgical history on file.  No family history on file.   Exam  BP 110/70   Pulse (!) 105   Wt 154 lb (69.9 kg)   LMP 02/10/2023   BMI 26.43 kg/m   Chaperone present during exam  CONSTITUTIONAL: Well-developed, well-nourished female in no acute distress.  HENT:  Normocephalic, atraumatic, External right and left ear normal. Oropharynx is clear and moist EYES: Conjunctivae and EOM are normal. Pupils are equal, round, and reactive to light. No scleral icterus.  NECK: Normal range of motion, supple, no masses.  Normal thyroid.  CARDIOVASCULAR: Normal heart rate noted, regular rhythm RESPIRATORY: Clear to auscultation bilaterally. Effort and breath sounds  normal, no problems with respiration noted. BREASTS: not indicated ABDOMEN: Soft, normal bowel sounds, no distention noted.  No tenderness, rebound or guarding.  PELVIC: Not indicated MUSCULOSKELETAL: Normal range of motion. No tenderness.  No cyanosis, clubbing, or edema.  2+ distal pulses. SKIN: Skin is warm and dry. No rash noted. Not diaphoretic. No erythema. No pallor. NEUROLOGIC: Alert and oriented to person, place, and time. Normal reflexes, muscle tone coordination. No cranial nerve deficit noted. PSYCHIATRIC: Normal mood and affect. Normal behavior. Normal judgment and thought content.    Assessment:    Pregnancy: G1P0 Patient Active Problem List   Diagnosis Date Noted   Supervision of high risk pregnancy, antepartum 11/01/2023      Plan:   1. [redacted] weeks gestation of pregnancy (Primary)  2. Gestational diabetes mellitus (GDM), antepartum, gestational diabetes method of control unspecified [place] less than 85 1hr PP less than 130  I had a conversation with the patient that the standard of care would be to induce no later than her due date. This is standard throughout the [place]  and that this is also my recommendation to her. The rationale for induction is that there is an increase in IUFD after 40 weeks and we would want to induce in order to prevent this. She acknowledged the risks and  indicated that she would be willing to be induced at her due date. She did not want to schedule anything at the moment.    She would be due for growth scan this week, although I am unsure if I am able to get her in for the growth scan. - US  MFM OB DETAIL +14 WK; Future - US  MFM FETAL BPP WO NON STRESS; Future  3. Supervision of high risk pregnancy, antepartum FHT normal - US  MFM OB DETAIL +14 WK; Future - US  MFM FETAL BPP WO NON STRESS; Future  4. Fetal growth restriction antepartum After the patient left, I was able to review her records more completely. It appears that she was also being  followed for FGR with the AC < 6th %tile. I attempted to call her and sent her a MyChart message - this changes my recommendation from induction at 40 weeks to induction at 39 weeks. I sent a message to MFM to try to work her in for a repeat US  to check growth and BPP.   - US  MFM OB DETAIL +14 WK; Future - US  MFM FETAL BPP WO NON STRESS; Future   The nature of [facility] [employer] with multiple MDs and other Advanced Practice Providers was explained to patient; also emphasized that fellows, residents, and students are part of our team.   Problem list reviewed and updated. 75% of 30 min visit spent on counseling and coordination of care.    Lula Kolton J Dante Roudebush 11/02/2023

## 2023-11-08 ENCOUNTER — Ambulatory Visit (INDEPENDENT_AMBULATORY_CARE_PROVIDER_SITE_OTHER): Admitting: Family Medicine

## 2023-11-08 VITALS — BP 127/79 | HR 72 | Wt 154.0 lb

## 2023-11-08 DIAGNOSIS — O0993 Supervision of high risk pregnancy, unspecified, third trimester: Secondary | ICD-10-CM | POA: Diagnosis not present

## 2023-11-08 DIAGNOSIS — O24419 Gestational diabetes mellitus in pregnancy, unspecified control: Secondary | ICD-10-CM

## 2023-11-08 DIAGNOSIS — Z3A38 38 weeks gestation of pregnancy: Secondary | ICD-10-CM | POA: Diagnosis not present

## 2023-11-08 DIAGNOSIS — O365990 Maternal care for other known or suspected poor fetal growth, unspecified trimester, not applicable or unspecified: Secondary | ICD-10-CM

## 2023-11-08 DIAGNOSIS — O0990 Supervision of high risk pregnancy, unspecified, unspecified trimester: Secondary | ICD-10-CM

## 2023-11-08 NOTE — Progress Notes (Signed)
   PRENATAL VISIT NOTE  Subjective:  Anne Ritter is a 23 y.o. G1P0 at [redacted]w[redacted]d being seen today for ongoing prenatal care.  She is currently monitored for the following issues for this high-risk pregnancy and has Supervision of high risk pregnancy, antepartum; Gestational diabetes mellitus (GDM), antepartum; and Fetal growth restriction antepartum on their problem list.  Patient reports no complaints.  Contractions: Not present. Vag. Bleeding: None.  Movement: Present. Denies leaking of fluid.   The following portions of the patient's history were reviewed and updated as appropriate: allergies, current medications, past family history, past medical history, past social history, past surgical history and problem list.   Objective:   Vitals:   11/08/23 1618  BP: 127/79  Pulse: 72  Weight: 154 lb (69.9 kg)    Fetal Status: Fetal Heart Rate (bpm): 137   Movement: Present     General:  Alert, oriented and cooperative. Patient is in no acute distress.  Skin: Skin is warm and dry. No rash noted.   Cardiovascular: Normal heart rate noted  Respiratory: Normal respiratory effort, no problems with respiration noted  Abdomen: Soft, gravid, appropriate for gestational age.  Pain/Pressure: Absent     Pelvic: Cervical exam performed in the presence of a chaperone        Extremities: Normal range of motion.  Edema: None  Mental Status: Normal mood and affect. Normal behavior. Normal judgment and thought content.   Assessment and Plan:  Pregnancy: G1P0 at [redacted]w[redacted]d 1. [redacted] weeks gestation of pregnancy (Primary)  2. Supervision of high risk pregnancy, antepartum FHT normal  3. Gestational diabetes mellitus (GDM), antepartum, gestational diabetes method of control unspecified Controlled.   4. Fetal growth restriction antepartum Discussed induction - patient amenable to induction. Will induce at 39 weeks. Cytotec for induction  Term labor symptoms and general obstetric precautions including but not  limited to vaginal bleeding, contractions, leaking of fluid and fetal movement were reviewed in detail with the patient. Please refer to After Visit Summary for other counseling recommendations.   No follow-ups on file.  No future appointments.  Gailen Venne J Clary Meeker, DO

## 2023-11-09 ENCOUNTER — Telehealth (HOSPITAL_COMMUNITY): Payer: Self-pay | Admitting: *Deleted

## 2023-11-09 NOTE — Telephone Encounter (Signed)
 Preadmission screen

## 2023-11-10 ENCOUNTER — Encounter (HOSPITAL_COMMUNITY): Payer: Self-pay | Admitting: Obstetrics and Gynecology

## 2023-11-10 ENCOUNTER — Encounter (HOSPITAL_COMMUNITY): Payer: Self-pay | Admitting: Anesthesiology

## 2023-11-10 ENCOUNTER — Other Ambulatory Visit: Payer: Self-pay

## 2023-11-10 ENCOUNTER — Inpatient Hospital Stay (HOSPITAL_COMMUNITY)
Admission: AD | Admit: 2023-11-10 | Discharge: 2023-11-13 | DRG: 807 | Disposition: A | Discharge status: Home / Self Care | Attending: Family Medicine | Admitting: Family Medicine

## 2023-11-10 DIAGNOSIS — O4202 Full-term premature rupture of membranes, onset of labor within 24 hours of rupture: Secondary | ICD-10-CM | POA: Diagnosis not present

## 2023-11-10 DIAGNOSIS — O4292 Full-term premature rupture of membranes, unspecified as to length of time between rupture and onset of labor: Principal | ICD-10-CM | POA: Diagnosis present

## 2023-11-10 DIAGNOSIS — O403XX0 Polyhydramnios, third trimester, not applicable or unspecified: Secondary | ICD-10-CM | POA: Diagnosis present

## 2023-11-10 DIAGNOSIS — Z3A39 39 weeks gestation of pregnancy: Secondary | ICD-10-CM

## 2023-11-10 DIAGNOSIS — Z3490 Encounter for supervision of normal pregnancy, unspecified, unspecified trimester: Secondary | ICD-10-CM | POA: Diagnosis present

## 2023-11-10 DIAGNOSIS — O4290 Premature rupture of membranes, unspecified as to length of time between rupture and onset of labor, unspecified weeks of gestation: Secondary | ICD-10-CM | POA: Diagnosis present

## 2023-11-10 DIAGNOSIS — Z141 Cystic fibrosis carrier: Secondary | ICD-10-CM | POA: Diagnosis not present

## 2023-11-10 DIAGNOSIS — O0990 Supervision of high risk pregnancy, unspecified, unspecified trimester: Principal | ICD-10-CM

## 2023-11-10 DIAGNOSIS — O24420 Gestational diabetes mellitus in childbirth, diet controlled: Secondary | ICD-10-CM | POA: Diagnosis present

## 2023-11-10 DIAGNOSIS — O365930 Maternal care for other known or suspected poor fetal growth, third trimester, not applicable or unspecified: Secondary | ICD-10-CM | POA: Diagnosis present

## 2023-11-10 HISTORY — DX: Gestational diabetes mellitus in pregnancy, unspecified control: O24.419

## 2023-11-10 LAB — CBC
HCT: 31.7 % — ABNORMAL LOW (ref 36.0–46.0)
Hemoglobin: 10 g/dL — ABNORMAL LOW (ref 12.0–15.0)
MCH: 24.3 pg — ABNORMAL LOW (ref 26.0–34.0)
MCHC: 31.5 g/dL (ref 30.0–36.0)
MCV: 77.1 fL — ABNORMAL LOW (ref 80.0–100.0)
Platelets: 273 10*3/uL (ref 150–400)
RBC: 4.11 MIL/uL (ref 3.87–5.11)
RDW: 16.4 % — ABNORMAL HIGH (ref 11.5–15.5)
WBC: 10.1 10*3/uL (ref 4.0–10.5)
nRBC: 0 % (ref 0.0–0.2)

## 2023-11-10 LAB — TYPE AND SCREEN
ABO/RH(D): O POS
Antibody Screen: NEGATIVE

## 2023-11-10 NOTE — H&P (Shared)
 OBSTETRIC ADMISSION HISTORY AND PHYSICAL  Anne Ritter is a 23 y.o. female G1P0 with IUP at [redacted]w[redacted]d by LMP and US  presenting for SROM and SOL. She reports +FMs, endorses LOF which was clear but denies VB, blurry vision, headaches or peripheral edema, and RUQ pain.  She plans on breast feeding. She declines method for birth control. She received her prenatal care at  [facility] after transferring from [facility].   Dating: By US  --->  Estimated Date of Delivery: 11/17/23  Sono:    @[redacted]w[redacted]d , CWD, normal anatomy, Cephalic presentation, unable to locate fetal weight but was 11% @[redacted]w[redacted]d   Prenatal History/Complications:  Diet controlled gestational diabetes mellitus (GDM) in third trimester (CMD); Cystic fibrosis carrier; Poor fetal growth affecting management of mother in third trimester, single or unspecified fetus (CMD); Polyhydramnios in third trimester complication, single or unspecified fetus (CMD)   Past Medical History: Past Medical History:  Diagnosis Date   GERD (gastroesophageal reflux disease)    Gestational diabetes     Past Surgical History: Past Surgical History:  Procedure Laterality Date   NO PAST SURGERIES      Obstetrical History: OB History     Gravida  1   Para      Term      Preterm      AB      Living         SAB      IAB      Ectopic      Multiple      Live Births              Social History Social History   Socioeconomic History   Marital status: Married    Spouse name: Not on file   Number of children: Not on file   Years of education: Not on file   Highest education level: Not on file  Occupational History   Not on file  Tobacco Use   Smoking status: Never   Smokeless tobacco: Never  Vaping Use   Vaping status: Never Used  Substance and Sexual Activity   Alcohol use: No   Drug use: Never   Sexual activity: Not Currently  Other Topics Concern   Not on file  Social History Narrative   Husband lives in [place]    Social Drivers of Health   Financial Resource Strain: Not on file  Food Insecurity: No Food Insecurity (11/10/2023)   Hunger Vital Sign    Worried About Running Out of Food in the Last Year: Never true    Ran Out of Food in the Last Year: Never true  Transportation Needs: No Transportation Needs (11/10/2023)   PRAPARE - Administrator, Civil Service (Medical): No    Lack of Transportation (Non-Medical): No  Physical Activity: Not on file  Stress: Not on file  Social Connections: Not on file    Family History: History reviewed. No pertinent family history.  Allergies: No Known Allergies  Medications Prior to Admission  Medication Sig Dispense Refill Last Dose/Taking   Prenatal Vit-Fe Fumarate-FA (MULTIVITAMIN-PRENATAL) 27-0.8 MG TABS tablet Take 1 tablet by mouth daily at 12 noon.   11/09/2023 Evening   albuterol  (PROVENTIL  HFA;VENTOLIN  HFA) 108 (90 Base) MCG/ACT inhaler Inhale 1-2 puffs into the lungs every 6 (six) hours as needed for wheezing or shortness of breath. (Patient not taking: Reported on 11/08/2023) 1 Inhaler 0    tacrolimus  (PROTOPIC ) 0.1 % ointment Apply topically 2 (two) times daily. Apply topically to  the affected areas twice a day for two weeks then as needed for itch (Patient not taking: Reported on 11/08/2023) 100 g 0    tretinoin  (RETIN-A ) 0.025 % cream Apply topically at bedtime. Mix a pea sized amount with quarter size amount of moisturizer. Apply to affected areas every other night (Patient not taking: Reported on 11/08/2023) 45 g 0    triamcinolone  cream (KENALOG ) 0.1 % Apply 1 Application topically 2 (two) times daily. (Patient not taking: Reported on 11/08/2023) 30 g 0      Review of Systems   All systems reviewed and negative except as stated in HPI  Blood pressure 136/89, pulse 70, temperature 98 F (36.7 C), temperature source Oral, resp. rate 18, height 5\' 3"  (1.6 m), weight 69.9 kg, last menstrual period 02/10/2023, SpO2 100%. General  appearance: Alert, Cooperative, Minimal distress with contractions. Lungs: clear to auscultation bilaterally Heart: regular rate and rhythm Abdomen: soft, non-tender; bowel sounds normal Pelvic: per cervical exam Extremities: Homans sign is negative, no sign of DVT DTR's: Not assessed Presentation: Vertex by BME Fetal monitoring Uterine activity Dilation: 5.5 Effacement (%): 80 Station: -1 Exam by:: Mickeal Aland, RN   Prenatal labs: ABO, Rh: --/--/O POS (04/26 2200) Antibody: NEG (04/26 2200) Rubella:  Pending RPR:   Pending HBsAg:   Pending HIV:   Pending GBS: Negative/-- (04/02 0000)    Lab Results  Component Value Date   GBS Negative 10/17/2023   GTT Failed (139 at 1 hour) passed 3 hour early. Diet controlled. Genetic screening  Unknown Anatomy US  Normal    There is no immunization history on file for this patient.  Prenatal Transfer Tool  Maternal Diabetes: Yes:  Diabetes Type:  Diet controlled Genetic Screening: Declined Maternal Ultrasounds/Referrals: Normal Fetal Ultrasounds or other Referrals:  Referred to Materal Fetal Medicine  Maternal Substance Abuse:  No  Significant Maternal Medications:  None Significant Maternal Lab Results: Group B Strep negative Number of Prenatal Visits:greater than 3 verified prenatal visits Maternal Vaccinations:Flu and Covid Other Comments:  None   Results for orders placed or performed during the hospital encounter of 11/10/23 (from the past 24 hours)  Type and screen [facility]   Collection Time: 11/10/23 10:00 PM  Result Value Ref Range   ABO/RH(D) O POS    Antibody Screen NEG    Sample Expiration      11/13/2023,2359 Performed at [facility], [address], [place], [place] [address]   CBC   Collection Time: 11/10/23 10:01 PM  Result Value Ref Range   WBC 10.1 4.0 - 10.5 K/uL   RBC 4.11 3.87 - 5.11 MIL/uL   Hemoglobin 10.0 (L) 12.0 - 15.0 g/dL   HCT 60.4 (L) 54.0 - 98.1 %   MCV 77.1 (L)  80.0 - 100.0 fL   MCH 24.3 (L) 26.0 - 34.0 pg   MCHC 31.5 30.0 - 36.0 g/dL   RDW 19.1 (H) 47.8 - 29.5 %   Platelets 273 150 - 400 K/uL   nRBC 0.0 0.0 - 0.2 %    Patient Active Problem List   Diagnosis Date Noted   SVD (spontaneous vaginal delivery) 11/11/2023   PROM (premature rupture of membranes) 11/11/2023   Polyhydramnios affecting pregnancy in third trimester 11/11/2023   Gestational diabetes mellitus (GDM), antepartum 11/08/2023   Fetal growth restriction antepartum 11/08/2023   Supervision of high risk pregnancy, antepartum 11/01/2023    Assessment/Plan:  Francessca January is a 23 y.o. G1P0 at [redacted]w[redacted]d here for SROM  at 1900. Preg  #Labor: Expectant #Pain: Non-pharmacological methods #FWB: Cat I  #GBS status:  negative #Feeding: Breastmilk  #Reproductive Life planning: Undecided #Circ:  no  Monda Angry, Medical Student   Attestation of Supervision of Student:  I confirm that I have verified the information documented in the medical student's note and that I have personally performed the history, physical exam and all medical decision making activities.  I have verified that all services and findings are accurately documented in this student's note; and I agree with management and plan as outlined in the documentation. I have also made any necessary editorial changes.  Kraig Peru, CNM [facility] [employer], [facility] 11/11/2023 6:18 AM

## 2023-11-10 NOTE — MAU Note (Signed)
.  MAU Triage Note:  .Anne Ritter is a 23 y.o. at [redacted]w[redacted]d here in MAU reporting: contractions which started this morning at 530. Felt a gush of fluid at 1900, clear fluid.   Denies vaginal bleeding, endorses +FM.   Pain Score: 4  Pain Location: Abdomen     Onset of complaint: 0530am LMP: Patient's last menstrual period was 02/10/2023.  Vitals:   11/10/23 2058  BP: 135/82  Pulse: 89  Resp: 16  Temp: 98 F (36.7 C)    FHT:  Fetal Heart Rate Mode: External Baseline Rate (A): 140 bpm Lab orders placed from triage: labor eval

## 2023-11-10 NOTE — Plan of Care (Signed)

## 2023-11-10 NOTE — Plan of Care (Signed)
 Problem: Education: Goal: Knowledge of General Education information will improve Description: Including pain rating scale, medication(s)/side effects and non-pharmacologic comfort measures 11/10/2023 2316 by Gordon Latus, RN Outcome: Progressing 11/10/2023 2314 by Gordon Latus, RN Outcome: Progressing   Problem: Health Behavior/Discharge Planning: Goal: Ability to manage health-related needs will improve 11/10/2023 2316 by Gordon Latus, RN Outcome: Progressing 11/10/2023 2314 by Gordon Latus, RN Outcome: Progressing   Problem: Clinical Measurements: Goal: Ability to maintain clinical measurements within normal limits will improve 11/10/2023 2316 by Gordon Latus, RN Outcome: Progressing 11/10/2023 2314 by Gordon Latus, RN Outcome: Progressing Goal: Will remain free from infection 11/10/2023 2316 by Gordon Latus, RN Outcome: Progressing 11/10/2023 2314 by Gordon Latus, RN Outcome: Progressing Goal: Diagnostic test results will improve 11/10/2023 2316 by Gordon Latus, RN Outcome: Progressing 11/10/2023 2314 by Gordon Latus, RN Outcome: Progressing Goal: Respiratory complications will improve 11/10/2023 2316 by Gordon Latus, RN Outcome: Progressing 11/10/2023 2314 by Gordon Latus, RN Outcome: Progressing Goal: Cardiovascular complication will be avoided 11/10/2023 2316 by Gordon Latus, RN Outcome: Progressing 11/10/2023 2314 by Gordon Latus, RN Outcome: Progressing   Problem: Activity: Goal: Risk for activity intolerance will decrease 11/10/2023 2316 by Gordon Latus, RN Outcome: Progressing 11/10/2023 2314 by Gordon Latus, RN Outcome: Progressing   Problem: Nutrition: Goal: Adequate nutrition will be maintained 11/10/2023 2316 by Gordon Latus, RN Outcome: Progressing 11/10/2023 2314 by Gordon Latus, RN Outcome: Progressing   Problem: Coping: Goal: Level of anxiety will decrease 11/10/2023 2316 by Gordon Latus, RN Outcome: Progressing 11/10/2023 2314 by Gordon Latus, RN Outcome: Progressing   Problem: Elimination: Goal: Will not experience complications related to bowel motility 11/10/2023 2316 by Gordon Latus, RN Outcome: Progressing 11/10/2023 2314 by Gordon Latus, RN Outcome: Progressing Goal: Will not experience complications related to urinary retention 11/10/2023 2316 by Gordon Latus, RN Outcome: Progressing 11/10/2023 2314 by Gordon Latus, RN Outcome: Progressing   Problem: Pain Managment: Goal: General experience of comfort will improve and/or be controlled 11/10/2023 2316 by Gordon Latus, RN Outcome: Progressing 11/10/2023 2314 by Gordon Latus, RN Outcome: Progressing   Problem: Safety: Goal: Ability to remain free from injury will improve 11/10/2023 2316 by Gordon Latus, RN Outcome: Progressing 11/10/2023 2314 by Gordon Latus, RN Outcome: Progressing   Problem: Skin Integrity: Goal: Risk for impaired skin integrity will decrease 11/10/2023 2316 by Gordon Latus, RN Outcome: Progressing 11/10/2023 2314 by Gordon Latus, RN Outcome: Progressing   Problem: Education: Goal: Knowledge of Childbirth will improve Outcome: Progressing Goal: Ability to make informed decisions regarding treatment and plan of care will improve Outcome: Progressing Goal: Ability to state and carry out methods to decrease the pain will improve Outcome: Progressing Goal: Individualized Educational Video(s) Outcome: Progressing   Problem: Coping: Goal: Ability to verbalize concerns and feelings about labor and delivery will improve Outcome: Progressing   Problem: Life Cycle: Goal: Ability to make normal progression through stages of labor will improve Outcome: Progressing Goal: Ability to effectively push during vaginal delivery will improve Outcome: Progressing   Problem: Role Relationship: Goal: Will demonstrate positive interactions with the  child Outcome: Progressing   Problem: Safety: Goal: Risk of complications during labor and delivery will decrease Outcome: Progressing   Problem: Pain Management: Goal: Relief or control of pain from uterine contractions will improve Outcome: Progressing   Problem: Education: Goal: Ability to describe self-care measures that  may prevent or decrease complications (Diabetes Survival Skills Education) will improve Outcome: Progressing Goal: Individualized Educational Video(s) Outcome: Progressing   Problem: Coping: Goal: Ability to adjust to condition or change in health will improve Outcome: Progressing   Problem: Fluid Volume: Goal: Ability to maintain a balanced intake and output will improve Outcome: Progressing   Problem: Health Behavior/Discharge Planning: Goal: Ability to identify and utilize available resources and services will improve Outcome: Progressing   Problem: Metabolic: Goal: Ability to maintain appropriate glucose levels will improve Outcome: Progressing   Problem: Nutritional: Goal: Maintenance of adequate nutrition will improve Outcome: Progressing Goal: Progress toward achieving an optimal weight will improve Outcome: Progressing

## 2023-11-11 ENCOUNTER — Inpatient Hospital Stay (HOSPITAL_COMMUNITY)

## 2023-11-11 ENCOUNTER — Inpatient Hospital Stay (HOSPITAL_COMMUNITY): Admission: RE | Admit: 2023-11-11 | Source: Home / Self Care | Admitting: Obstetrics and Gynecology

## 2023-11-11 ENCOUNTER — Encounter (HOSPITAL_COMMUNITY): Payer: Self-pay | Admitting: Obstetrics and Gynecology

## 2023-11-11 DIAGNOSIS — O4202 Full-term premature rupture of membranes, onset of labor within 24 hours of rupture: Secondary | ICD-10-CM | POA: Diagnosis not present

## 2023-11-11 DIAGNOSIS — O4290 Premature rupture of membranes, unspecified as to length of time between rupture and onset of labor, unspecified weeks of gestation: Secondary | ICD-10-CM | POA: Diagnosis present

## 2023-11-11 DIAGNOSIS — Z3A39 39 weeks gestation of pregnancy: Secondary | ICD-10-CM

## 2023-11-11 DIAGNOSIS — O403XX0 Polyhydramnios, third trimester, not applicable or unspecified: Secondary | ICD-10-CM | POA: Diagnosis present

## 2023-11-11 DIAGNOSIS — O24420 Gestational diabetes mellitus in childbirth, diet controlled: Secondary | ICD-10-CM | POA: Diagnosis not present

## 2023-11-11 DIAGNOSIS — Z3490 Encounter for supervision of normal pregnancy, unspecified, unspecified trimester: Secondary | ICD-10-CM | POA: Diagnosis present

## 2023-11-11 LAB — SYPHILIS: RPR W/REFLEX TO RPR TITER AND TREPONEMAL ANTIBODIES, TRADITIONAL SCREENING AND DIAGNOSIS ALGORITHM: RPR Ser Ql: NONREACTIVE

## 2023-11-11 LAB — HIV ANTIBODY (ROUTINE TESTING W REFLEX): HIV Screen 4th Generation wRfx: NONREACTIVE

## 2023-11-11 LAB — HEPATITIS B SURFACE ANTIGEN: Hepatitis B Surface Ag: NONREACTIVE

## 2023-11-11 LAB — HEPATITIS C ANTIBODY: HCV Ab: NONREACTIVE

## 2023-11-11 LAB — GLUCOSE, CAPILLARY: Glucose-Capillary: 75 mg/dL (ref 70–99)

## 2023-11-11 MED ORDER — OXYCODONE-ACETAMINOPHEN 5-325 MG PO TABS
2.0000 | ORAL_TABLET | ORAL | Status: DC | PRN
Start: 2023-11-11 — End: 2023-11-11

## 2023-11-11 MED ORDER — OXYCODONE-ACETAMINOPHEN 5-325 MG PO TABS: 1.0000 | ORAL_TABLET | ORAL | Status: DC | PRN

## 2023-11-11 MED ORDER — LACTATED RINGERS IV SOLN: 500.0000 mL | INTRAVENOUS | Status: DC | PRN

## 2023-11-11 MED ORDER — ONDANSETRON HCL 4 MG/2ML IJ SOLN: 4.0000 mg | Freq: Four times a day (QID) | INTRAMUSCULAR | Status: DC | PRN

## 2023-11-11 MED ORDER — OXYTOCIN-SODIUM CHLORIDE 30-0.9 UT/500ML-% IV SOLN: 2.5000 [IU]/h | INTRAVENOUS | Status: DC

## 2023-11-11 MED ORDER — ACETAMINOPHEN 325 MG PO TABS
650.0000 mg | ORAL_TABLET | ORAL | Status: DC | PRN
Start: 2023-11-11 — End: 2023-11-11

## 2023-11-11 MED ORDER — SODIUM CHLORIDE 0.9% FLUSH
3.0000 mL | Freq: Two times a day (BID) | INTRAVENOUS | Status: DC
  Administered 2023-11-11: 3 mL via INTRAVENOUS

## 2023-11-11 MED ORDER — LIDOCAINE HCL (PF) 1 % IJ SOLN: 30.0000 mL | INTRAMUSCULAR | Status: DC | PRN

## 2023-11-11 MED ORDER — ONDANSETRON HCL 4 MG/2ML IJ SOLN: 4.0000 mg | INTRAMUSCULAR | Status: DC | PRN

## 2023-11-11 MED ORDER — ACETAMINOPHEN 325 MG PO TABS
650.0000 mg | ORAL_TABLET | ORAL | Status: DC | PRN
  Administered 2023-11-11: 650 mg via ORAL
  Filled 2023-11-11: qty 2

## 2023-11-11 MED ORDER — MEASLES, MUMPS & RUBELLA VAC IJ SOLR: 0.5000 mL | Freq: Once | INTRAMUSCULAR | Status: DC

## 2023-11-11 MED ORDER — SOD CITRATE-CITRIC ACID 500-334 MG/5ML PO SOLN
30.0000 mL | ORAL | Status: DC | PRN
Start: 2023-11-11 — End: 2023-11-11

## 2023-11-11 MED ORDER — COCONUT OIL OIL
1.0000 | TOPICAL_OIL | Status: DC | PRN
  Administered 2023-11-12: 1 via TOPICAL

## 2023-11-11 MED ORDER — ONDANSETRON HCL 4 MG PO TABS: 4.0000 mg | ORAL_TABLET | ORAL | Status: DC | PRN

## 2023-11-11 MED ORDER — DIPHENHYDRAMINE HCL 25 MG PO CAPS: 25.0000 mg | ORAL_CAPSULE | Freq: Four times a day (QID) | ORAL | Status: DC | PRN

## 2023-11-11 MED ORDER — LACTATED RINGERS IV SOLN: INTRAVENOUS | Status: DC

## 2023-11-11 MED ORDER — SODIUM CHLORIDE 0.9% FLUSH: 3.0000 mL | INTRAVENOUS | Status: DC | PRN

## 2023-11-11 MED ORDER — BENZOCAINE-MENTHOL 20-0.5 % EX AERO
1.0000 | INHALATION_SPRAY | CUTANEOUS | Status: DC | PRN
  Filled 2023-11-11: qty 56

## 2023-11-11 MED ORDER — OXYCODONE HCL 5 MG PO TABS: 10.0000 mg | ORAL_TABLET | ORAL | Status: DC | PRN

## 2023-11-11 MED ORDER — SIMETHICONE 80 MG PO CHEW: 80.0000 mg | CHEWABLE_TABLET | ORAL | Status: DC | PRN

## 2023-11-11 MED ORDER — SENNOSIDES-DOCUSATE SODIUM 8.6-50 MG PO TABS
2.0000 | ORAL_TABLET | ORAL | Status: DC
  Administered 2023-11-11 – 2023-11-13 (×3): 2 via ORAL
  Filled 2023-11-11 (×3): qty 2

## 2023-11-11 MED ORDER — LIDOCAINE HCL (PF) 1 % IJ SOLN
INTRAMUSCULAR | Status: AC
  Administered 2023-11-11: 30 mL
  Filled 2023-11-11: qty 30

## 2023-11-11 MED ORDER — OXYTOCIN BOLUS FROM INFUSION: 333.0000 mL | Freq: Once | INTRAVENOUS | Status: DC

## 2023-11-11 MED ORDER — ACETAMINOPHEN 325 MG PO TABS
650.0000 mg | ORAL_TABLET | ORAL | Status: DC | PRN
  Administered 2023-11-12: 650 mg via ORAL
  Filled 2023-11-11: qty 2

## 2023-11-11 MED ORDER — OXYCODONE HCL 5 MG PO TABS: 5.0000 mg | ORAL_TABLET | ORAL | Status: DC | PRN

## 2023-11-11 MED ORDER — PRENATAL MULTIVITAMIN CH
1.0000 | ORAL_TABLET | Freq: Every day | ORAL | Status: DC
Start: 2023-11-11 — End: 2023-11-14
  Administered 2023-11-11 – 2023-11-13 (×3): 1 via ORAL
  Filled 2023-11-11 (×3): qty 1

## 2023-11-11 MED ORDER — SODIUM CHLORIDE 0.9 % IV SOLN: 250.0000 mL | INTRAVENOUS | Status: DC | PRN

## 2023-11-11 MED ORDER — SOD CITRATE-CITRIC ACID 500-334 MG/5ML PO SOLN: 30.0000 mL | ORAL | Status: DC | PRN

## 2023-11-11 MED ORDER — TETANUS-DIPHTH-ACELL PERTUSSIS 5-2.5-18.5 LF-MCG/0.5 IM SUSY: 0.5000 mL | PREFILLED_SYRINGE | Freq: Once | INTRAMUSCULAR | Status: DC

## 2023-11-11 MED ORDER — DIBUCAINE (PERIANAL) 1 % EX OINT: 1.0000 | TOPICAL_OINTMENT | CUTANEOUS | Status: DC | PRN

## 2023-11-11 MED ORDER — IBUPROFEN 800 MG PO TABS
800.0000 mg | ORAL_TABLET | Freq: Three times a day (TID) | ORAL | Status: DC
  Administered 2023-11-11 – 2023-11-13 (×8): 800 mg via ORAL
  Filled 2023-11-11 (×8): qty 1

## 2023-11-11 MED ORDER — WITCH HAZEL-GLYCERIN EX PADS: 1.0000 | MEDICATED_PAD | CUTANEOUS | Status: DC | PRN

## 2023-11-11 MED ORDER — OXYCODONE-ACETAMINOPHEN 5-325 MG PO TABS: 2.0000 | ORAL_TABLET | ORAL | Status: DC | PRN

## 2023-11-11 MED ORDER — LACTATED RINGERS IV SOLN
INTRAVENOUS | Status: DC
Start: 2023-11-11 — End: 2023-11-11

## 2023-11-11 NOTE — Progress Notes (Signed)
 Patient ID: Anne Ritter, female   DOB: [birth year], 23 y.o.   MRN: [id number]  Subjective: -Patient sitting on birthing ball in shower upon provider entry. Doula-Cassey providing support and assistance. Patient coping well and reporting urge to push.   Objective: BP 136/89   Pulse 70   Temp 98.2 F (36.8 C) (Oral)   Resp 18   Ht 5\' 3"  (1.6 m)   Wt 69.9 kg   LMP 02/10/2023   SpO2 100%   BMI 27.28 kg/m  No intake/output data recorded. No intake/output data recorded.  Fetal Monitoring: FHT: 115 bpm, Mod Var, -Decels, +Accels UC: Palpates strong    Vaginal Exam: SVE:   Dilation: Lip/rim Effacement (%): 100 Station: 0 Exam by:: J Prescott Truex CNM Membranes:SROM x 6hrs Internal Monitors: None  Augmentation/Induction: Pitocin:None Cytotec: None  Assessment:  IUP at 39.1 weeks Cat I FT  Active Labor  Plan: -Informed that unable to push currently. -Discussed continued management of symptoms. -Patient questions availability of epidural and informed it is available if she desires. -Continue other mgmt as ordered. -Anticipate SVD.   Charlee Conine, CNM Advanced Practice Provider, [facility] for [facility] 11/11/2023, 2:24 AM

## 2023-11-11 NOTE — Discharge Summary (Signed)
 Postpartum Discharge Summary  Date of Service updated***     Patient Name: Anne Ritter DOB: Jan 13, 2001 MRN: [id number]  Date of admission: 11/10/2023 Delivery date:11/11/2023 Delivering provider: Loetta Ringer Date of discharge: 11/11/2023  Admitting diagnosis: water broke ctx Intrauterine pregnancy: [redacted]w[redacted]d     Secondary diagnosis:  Principal Problem:   SVD (spontaneous vaginal delivery)  Additional problems: ***    Discharge diagnosis: Term Pregnancy Delivered, GDM A1, and Fetal Growth Restriction                                               Post partum procedures:{Postpartum procedures:23558} Augmentation: N/A Complications: {OB Labor/Delivery Complications:20784}  Hospital course: Onset of Labor With Vaginal Delivery      23 y.o. yo G1P0 at [redacted]w[redacted]d was admitted for SROM on 11/10/2023. Labor course was uncomplicated  Membrane Rupture Time/Date: 7:00 PM,11/10/2023  Delivery Method:Vaginal, Spontaneous Operative Delivery:N/A Episiotomy: None Lacerations:    Patient had a postpartum course complicated by ***.  She is ambulating, tolerating a regular diet, passing flatus, and urinating well. Patient is discharged home in stable condition on 11/11/23.  Newborn Data: Birth date:11/11/2023 Birth time:3:45 AM Gender:Female Living status:Living Apgars: ,  Weight:   Magnesium Sulfate received: {Mag received:30440022} BMZ received: No Rhophylac:N/A MMR:{MMR:30440033} T-DaP:{Tdap:23962} Flu: No RSV Vaccine received: No Transfusion:{Transfusion received:30440034}  Immunizations received:  There is no immunization history on file for this patient.  Physical exam  Vitals:   11/11/23 0058 11/11/23 0100 11/11/23 0121 11/11/23 0130  BP:      Pulse:      Resp:      Temp:    98.2 F (36.8 C)  TempSrc:    Oral  SpO2: 96% 99% 100%   Weight:      Height:       General: {Exam; general:21111117} Lochia: {Desc; appropriate/inappropriate:30686::"appropriate"} Uterine  Fundus: {Desc; firm/soft:30687} Incision: {Exam; incision:21111123} DVT Evaluation: {Exam; dvt:2111122} Labs: Lab Results  Component Value Date   WBC 10.1 11/10/2023   HGB 10.0 (L) 11/10/2023   HCT 31.7 (L) 11/10/2023   MCV 77.1 (L) 11/10/2023   PLT 273 11/10/2023      Latest Ref Rng & Units 08/29/2022   11:50 AM  CMP  Glucose 70 - 99 mg/dL 88   BUN 6 - 20 mg/dL 9   Creatinine 4.09 - 8.11 mg/dL 9.14   Sodium 782 - 956 mmol/L 140   Potassium 3.5 - 5.2 mmol/L 4.6   Chloride 96 - 106 mmol/L 106   CO2 20 - 29 mmol/L 20   Calcium 8.7 - 10.2 mg/dL 9.2    Edinburgh Score:     No data to display         No data recorded  After visit meds:  Allergies as of 11/11/2023   No Known Allergies   Med Rec must be completed prior to using this [facility]***        Discharge home in stable condition Infant Feeding: {Baby feeding:23562} Infant Disposition:{CHL IP OB HOME WITH OZHYQM:57846} Discharge instruction: per After Visit Summary and Postpartum booklet. Activity: Advance as tolerated. Pelvic rest for 6 weeks.  Diet: {OB NGEX:52841324} Future Appointments:No future appointments. Follow up Visit: Message sent to [facility] 4/27  Please schedule this patient for a In person postpartum visit in 6 weeks with the following provider: Any provider. Additional Postpartum F/U:2 hour GTT  High  risk pregnancy complicated by: GDM Delivery mode:  Vaginal, Spontaneous Anticipated Birth Control:  Condoms   11/11/2023 Melanie Spires, MD

## 2023-11-11 NOTE — Plan of Care (Signed)
 Problem: Education: Goal: Knowledge of General Education information will improve Description: Including pain rating scale, medication(s)/side effects and non-pharmacologic comfort measures 11/11/2023 0614 by Gordon Latus, RN Outcome: Completed/Met 11/10/2023 2316 by Gordon Latus, RN Outcome: Progressing 11/10/2023 2314 by Gordon Latus, RN Outcome: Progressing   Problem: Health Behavior/Discharge Planning: Goal: Ability to manage health-related needs will improve 11/11/2023 0614 by Gordon Latus, RN Outcome: Completed/Met 11/10/2023 2316 by Gordon Latus, RN Outcome: Progressing 11/10/2023 2314 by Gordon Latus, RN Outcome: Progressing   Problem: Clinical Measurements: Goal: Ability to maintain clinical measurements within normal limits will improve 11/11/2023 0614 by Gordon Latus, RN Outcome: Completed/Met 11/10/2023 2316 by Gordon Latus, RN Outcome: Progressing 11/10/2023 2314 by Gordon Latus, RN Outcome: Progressing Goal: Will remain free from infection 11/11/2023 0614 by Gordon Latus, RN Outcome: Completed/Met 11/10/2023 2316 by Gordon Latus, RN Outcome: Progressing 11/10/2023 2314 by Gordon Latus, RN Outcome: Progressing Goal: Diagnostic test results will improve 11/11/2023 0614 by Gordon Latus, RN Outcome: Completed/Met 11/10/2023 2316 by Gordon Latus, RN Outcome: Progressing 11/10/2023 2314 by Gordon Latus, RN Outcome: Progressing Goal: Respiratory complications will improve 11/11/2023 0614 by Gordon Latus, RN Outcome: Completed/Met 11/10/2023 2316 by Gordon Latus, RN Outcome: Progressing 11/10/2023 2314 by Gordon Latus, RN Outcome: Progressing Goal: Cardiovascular complication will be avoided 11/11/2023 1610 by Gordon Latus, RN Outcome: Completed/Met 11/10/2023 2316 by Gordon Latus, RN Outcome: Progressing 11/10/2023 2314 by Gordon Latus, RN Outcome: Progressing   Problem: Activity: Goal:  Risk for activity intolerance will decrease 11/11/2023 0614 by Gordon Latus, RN Outcome: Completed/Met 11/10/2023 2316 by Gordon Latus, RN Outcome: Progressing 11/10/2023 2314 by Gordon Latus, RN Outcome: Progressing   Problem: Nutrition: Goal: Adequate nutrition will be maintained 11/11/2023 0614 by Gordon Latus, RN Outcome: Completed/Met 11/10/2023 2316 by Gordon Latus, RN Outcome: Progressing 11/10/2023 2314 by Gordon Latus, RN Outcome: Progressing   Problem: Coping: Goal: Level of anxiety will decrease 11/11/2023 0614 by Gordon Latus, RN Outcome: Completed/Met 11/10/2023 2316 by Gordon Latus, RN Outcome: Progressing 11/10/2023 2314 by Gordon Latus, RN Outcome: Progressing   Problem: Elimination: Goal: Will not experience complications related to bowel motility 11/11/2023 0614 by Gordon Latus, RN Outcome: Completed/Met 11/10/2023 2316 by Gordon Latus, RN Outcome: Progressing 11/10/2023 2314 by Gordon Latus, RN Outcome: Progressing Goal: Will not experience complications related to urinary retention 11/11/2023 0614 by Gordon Latus, RN Outcome: Completed/Met 11/10/2023 2316 by Gordon Latus, RN Outcome: Progressing 11/10/2023 2314 by Gordon Latus, RN Outcome: Progressing   Problem: Pain Managment: Goal: General experience of comfort will improve and/or be controlled 11/11/2023 0614 by Gordon Latus, RN Outcome: Completed/Met 11/10/2023 2316 by Gordon Latus, RN Outcome: Progressing 11/10/2023 2314 by Gordon Latus, RN Outcome: Progressing   Problem: Safety: Goal: Ability to remain free from injury will improve 11/11/2023 0614 by Gordon Latus, RN Outcome: Completed/Met 11/10/2023 2316 by Gordon Latus, RN Outcome: Progressing 11/10/2023 2314 by Gordon Latus, RN Outcome: Progressing   Problem: Skin Integrity: Goal: Risk for impaired skin integrity will decrease 11/11/2023 0614 by Gordon Latus,  RN Outcome: Completed/Met 11/10/2023 2316 by Gordon Latus, RN Outcome: Progressing 11/10/2023 2314 by Gordon Latus, RN Outcome: Progressing   Problem: Education: Goal: Knowledge of Childbirth will improve 11/11/2023 0614 by Gordon Latus, RN Outcome: Completed/Met 11/10/2023 2316 by Gordon Latus,  RN Outcome: Progressing Goal: Ability to make informed decisions regarding treatment and plan of care will improve 11/11/2023 0614 by Gordon Latus, RN Outcome: Completed/Met 11/10/2023 2316 by Gordon Latus, RN Outcome: Progressing Goal: Ability to state and carry out methods to decrease the pain will improve 11/11/2023 0614 by Gordon Latus, RN Outcome: Completed/Met 11/10/2023 2316 by Gordon Latus, RN Outcome: Progressing Goal: Individualized Educational Video(s) 11/11/2023 0614 by Gordon Latus, RN Outcome: Completed/Met 11/10/2023 2316 by Gordon Latus, RN Outcome: Progressing   Problem: Coping: Goal: Ability to verbalize concerns and feelings about labor and delivery will improve 11/11/2023 0614 by Gordon Latus, RN Outcome: Completed/Met 11/10/2023 2316 by Gordon Latus, RN Outcome: Progressing   Problem: Life Cycle: Goal: Ability to make normal progression through stages of labor will improve 11/11/2023 0614 by Gordon Latus, RN Outcome: Completed/Met 11/10/2023 2316 by Gordon Latus, RN Outcome: Progressing Goal: Ability to effectively push during vaginal delivery will improve 11/11/2023 0614 by Gordon Latus, RN Outcome: Completed/Met 11/10/2023 2316 by Gordon Latus, RN Outcome: Progressing   Problem: Role Relationship: Goal: Will demonstrate positive interactions with the child 11/11/2023 1610 by Gordon Latus, RN Outcome: Completed/Met 11/10/2023 2316 by Gordon Latus, RN Outcome: Progressing   Problem: Safety: Goal: Risk of complications during labor and delivery will decrease 11/11/2023 0614 by Gordon Latus,  RN Outcome: Completed/Met 11/10/2023 2316 by Gordon Latus, RN Outcome: Progressing   Problem: Pain Management: Goal: Relief or control of pain from uterine contractions will improve 11/11/2023 0614 by Gordon Latus, RN Outcome: Completed/Met 11/10/2023 2316 by Gordon Latus, RN Outcome: Progressing   Problem: Education: Goal: Ability to describe self-care measures that may prevent or decrease complications (Diabetes Survival Skills Education) will improve 11/11/2023 0614 by Gordon Latus, RN Outcome: Completed/Met 11/10/2023 2316 by Gordon Latus, RN Outcome: Progressing Goal: Individualized Educational Video(s) 11/11/2023 0614 by Gordon Latus, RN Outcome: Completed/Met 11/10/2023 2316 by Gordon Latus, RN Outcome: Progressing   Problem: Coping: Goal: Ability to adjust to condition or change in health will improve 11/11/2023 0614 by Gordon Latus, RN Outcome: Completed/Met 11/10/2023 2316 by Gordon Latus, RN Outcome: Progressing   Problem: Fluid Volume: Goal: Ability to maintain a balanced intake and output will improve 11/11/2023 0614 by Gordon Latus, RN Outcome: Completed/Met 11/10/2023 2316 by Gordon Latus, RN Outcome: Progressing   Problem: Health Behavior/Discharge Planning: Goal: Ability to identify and utilize available resources and services will improve 11/11/2023 0614 by Gordon Latus, RN Outcome: Completed/Met 11/10/2023 2316 by Gordon Latus, RN Outcome: Progressing   Problem: Metabolic: Goal: Ability to maintain appropriate glucose levels will improve 11/11/2023 0614 by Gordon Latus, RN Outcome: Completed/Met 11/10/2023 2316 by Gordon Latus, RN Outcome: Progressing   Problem: Nutritional: Goal: Maintenance of adequate nutrition will improve 11/11/2023 0614 by Gordon Latus, RN Outcome: Completed/Met 11/10/2023 2316 by Gordon Latus, RN Outcome: Progressing Goal: Progress toward achieving an optimal weight  will improve 11/11/2023 0614 by Gordon Latus, RN Outcome: Completed/Met 11/10/2023 2316 by Gordon Latus, RN Outcome: Progressing

## 2023-11-11 NOTE — Lactation Note (Signed)
 This note was copied from a baby's chart. Lactation Consultation Note  Patient Name: Anne Ritter [id number] Date: 11/11/2023 Age:22 hours, P1 ,  Reason for consult: Initial assessment;Primapara;1st time breastfeeding;Term;Maternal endocrine disorder;Breastfeeding assistance Per mom the baby has been spitty , baby spitty x 2 with LC and bulb x 2  LC changed a medium sized soft black to mec stool.  The 2nd blood sugar drawn when LC in the room. 1st Blood sugar = 56 LC offered to see if the baby would latch and mom receptive.  LC 1st reviewed hand expressing with mom and tiny drop noted.  Baby to sleepy to latch. MOB asked the LC to wrapped the baby in a blanket because she was going to order a breakfast.  LC reviewed 24 hour breast feeding goals - feed with cues and by 3 hours check the diaper, change if needed and place the baby STS.  LC reassured mom its normal the baby is sleepy and to try again in 1 -2 hours.  Maternal Data Has patient been taught Hand Expression?: Yes (tiny drop)  Feeding Mother's Current Feeding Choice: Breast Milk  LATCH Score Latch: Too sleepy or reluctant, no latch achieved, no sucking elicited.  Audible Swallowing: None  Type of Nipple: Everted at rest and after stimulation (areola compresses well)  Comfort (Breast/Nipple): Soft / non-tender  Hold (Positioning): Assistance needed to correctly position infant at breast and maintain latch.  LATCH Score: 5    Interventions Interventions: Breast feeding basics reviewed;Assisted with latch;Skin to skin;Breast massage;Hand express;Adjust position;Support pillows;Education;LC Services brochure;CDC milk storage guidelines;CDC Guidelines for Breast Pump Cleaning  Discharge Pump: DEBP;Personal (per Mom Anne Ritter) WIC Program: Yes  Consult Status Consult Status: Follow-up Date: 11/11/23 Follow-up type: In-patient    Renda Carpen Allen Basista 11/11/2023, 9:12 AM

## 2023-11-11 NOTE — Lactation Note (Signed)
 This note was copied from a baby's chart. Lactation Consultation Note  Patient Name: Anne Ritter GNFAO'Z Date: 11/11/2023 Age:23 hours Reason for consult: Follow-up assessment;Primapara;1st time breastfeeding;Term;Mother's request Per mom the baby fed at 1120 for 10 mins  and has a stool.  LC changed the diaper - loose soft stool . Per mom no spitting since this am. LC encouraged mom to call for latch check.   Maternal Data Has patient been taught Hand Expression?: Yes  Feeding Mother's Current Feeding Choice: Breast Milk  LATCH Score- LC reminded mom to call for latch check for a score and so the nurse or the [facility] can see the latch       Interventions Interventions: Breast feeding basics reviewed;Assisted with latch;Skin to skin;Breast massage;Hand express;Adjust position;Support pillows;Education;LC Services brochure;CDC milk storage guidelines;CDC Guidelines for Breast Pump Cleaning  Discharge Pump: Personal;DEBP WIC Program: Yes  Consult Status Consult Status: Follow-up Date: 11/11/23 Follow-up type: In-patient    Renda Carpen Ijeoma Loor 11/11/2023, 11:45 AM

## 2023-11-11 NOTE — Plan of Care (Signed)
  Problem: Education: Goal: Knowledge of condition will improve Outcome: Progressing Goal: Individualized Educational Video(s) Outcome: Progressing Goal: Individualized Newborn Educational Video(s) Outcome: Progressing   Problem: Activity: Goal: Will verbalize the importance of balancing activity with adequate rest periods Outcome: Progressing Goal: Ability to tolerate increased activity will improve Outcome: Progressing   Problem: Coping: Goal: Ability to identify and utilize available resources and services will improve Outcome: Progressing   Problem: Life Cycle: Goal: Chance of risk for complications during the postpartum period will decrease Outcome: Progressing   Problem: Skin Integrity: Goal: Demonstration of wound healing without infection will improve Outcome: Progressing

## 2023-11-12 ENCOUNTER — Telehealth: Payer: Self-pay

## 2023-11-12 LAB — GLUCOSE, CAPILLARY: Glucose-Capillary: 91 mg/dL (ref 70–99)

## 2023-11-12 LAB — RUBELLA SCREEN: Rubella: 7.07 {index} (ref >0.99)

## 2023-11-12 NOTE — Progress Notes (Signed)
 Post Partum Day 1 Subjective: no complaints, up ad lib, voiding, and tolerating PO  Objective: Blood pressure 128/83, pulse 74, temperature 98.2 F (36.8 C), temperature source Oral, resp. rate 18, height 5\' 3"  (1.6 m), weight 69.9 kg, last menstrual period 02/10/2023, SpO2 100%, unknown if currently breastfeeding.  Physical Exam:  General: alert, cooperative, and no distress Lochia: appropriate Uterine Fundus: firm Incision: NA DVT Evaluation: No evidence of DVT seen on physical exam.  Recent Labs    11/10/23 2201  HGB 10.0*  HCT 31.7*    Assessment/Plan: Plan for discharge tomorrow, Breastfeeding, Lactation consult, Circumcision prior to discharge, and Contraception condoms   LOS: 2 days   Anne Ritter  Felipe Horton, CNM 11/12/2023, 9:56 AM

## 2023-11-12 NOTE — Telephone Encounter (Signed)
 Patient called to inform us  that she delivered. Scheduled her postpartum appointment.   Patient wanted me to send a message to Dr. Cathyann Cobia requesting a prescription for a belly binder. I told her I would route her request over to him.

## 2023-11-13 MED ORDER — IBUPROFEN 800 MG PO TABS: 800.0000 mg | ORAL_TABLET | Freq: Three times a day (TID) | ORAL | 0 refills | Status: DC

## 2023-11-13 MED ORDER — WITCH HAZEL-GLYCERIN EX PADS: 1.0000 | MEDICATED_PAD | CUTANEOUS | 12 refills | Status: DC | PRN

## 2023-11-13 MED ORDER — COCONUT OIL OIL: 1.0000 | TOPICAL_OIL | Status: AC | PRN

## 2023-11-13 MED ORDER — BENZOCAINE-MENTHOL 20-0.5 % EX AERO: 1.0000 | INHALATION_SPRAY | CUTANEOUS | Status: AC | PRN

## 2023-11-13 MED ORDER — ACETAMINOPHEN 325 MG PO TABS: 650.0000 mg | ORAL_TABLET | ORAL | Status: DC | PRN

## 2023-11-13 MED ORDER — ABDOMINAL BINDER/ELASTIC MED MISC: 1.0000 [IU] | 0 refills | Status: DC | PRN

## 2023-11-13 NOTE — Addendum Note (Signed)
 Addended by: Malka Sea on: 11/13/2023 10:34 AM   Modules accepted: Orders

## 2023-11-13 NOTE — Lactation Note (Addendum)
 This note was copied from a baby's chart. Lactation Consultation Note  Patient Name: Anne Ritter GNFAO'Z Date: 11/13/2023 Age:22 hours Reason for consult: Follow-up assessment;Maternal discharge;Term  P1, 39 wks, @ 57 hrs of life.  Infant on breast with LC arrival. Mom C/O pain While baby actively on breast. Demonstrated breaking latch, re-latching with big mouth latch. Nipple post feed- with breaking latch-  looks great, discussed what changes to look for with shallow latching. Discussed baby learning to use tongue/ small mouth vs tongue tie.Encouraged mom to keep working on big mouth latch with baby and use EBM or coconut oil after each feed. Discussed cluster feeding overnight/ early morning brings in our milk supply, shared expectations of milk coming in. Highlighted risk of engorgement. Discussed hand pump/express to soften breasts, motrin  as anti-inflammatory, and ice packs for 10-20 minutes post feed/pumping if still over-full is the best treatments for inflamed/engorged breasts. Hand pump provided. Re-enforced LC services and milk storage.  Feeding Mother's Current Feeding Choice: Breast Milk and Formula  LATCH Score Latch: Grasps breast easily, tongue down, lips flanged, rhythmical sucking.  Audible Swallowing: Spontaneous and intermittent  Type of Nipple: Everted at rest and after stimulation  Comfort (Breast/Nipple): Soft / non-tender  Hold (Positioning): Assistance needed to correctly position infant at breast and maintain latch.  LATCH Score: 9   Lactation Tools Discussed/Used    Interventions Interventions: Breast feeding basics reviewed;Assisted with latch;Hand express;Breast compression;Expressed milk;Coconut oil;Hand pump;DEBP;Education;LC Services brochure;CDC milk storage guidelines  Discharge Discharge Education: Engorgement and breast care Pump: DEBP;Manual;Personal  Consult Status Consult Status: Complete Date: 11/13/23 Follow-up type:  In-patient    [facility] [place] 11/13/2023, 1:02 PM

## 2023-11-13 NOTE — Patient Instructions (Addendum)
 Your appointment with Outpatient Lactation is: 5.16.2025 at 11:30 am [facility] (First Floor) [address] [place]  Check in under baby's name.  Please bring your baby hungry along with your pump and a bottle of either formula or expressed breast milk. Please also bring your pump flanges and we welcome support people! If you need lactation assistance before your appointment, please call [phone] and press 4 for lactation.

## 2023-11-24 ENCOUNTER — Telehealth (HOSPITAL_COMMUNITY): Payer: Self-pay

## 2023-11-24 NOTE — Telephone Encounter (Signed)
 11/24/2023 1356  Name: Jamayla Reichenberg MRN: [id number] DOB: 01/16/2001  Reason for Call:  Transition of Care Hospital Discharge Call  Contact Status: Patient Contact Status: Message  Language assistant needed:          Follow-Up Questions:    Dimple Francis Postnatal Depression Scale:  In the Past 7 Days:    PHQ2-9 Depression Scale:     Discharge Follow-up:    Post-discharge interventions: NA  Signature  Wadell Guild

## 2023-11-27 ENCOUNTER — Other Ambulatory Visit: Payer: Self-pay

## 2023-11-27 ENCOUNTER — Encounter (HOSPITAL_BASED_OUTPATIENT_CLINIC_OR_DEPARTMENT_OTHER): Payer: Self-pay | Admitting: Urology

## 2023-11-27 ENCOUNTER — Emergency Department (HOSPITAL_BASED_OUTPATIENT_CLINIC_OR_DEPARTMENT_OTHER)
Admission: EM | Admit: 2023-11-27 | Discharge: 2023-11-27 | Disposition: A | Discharge status: Home / Self Care | Attending: Emergency Medicine | Admitting: Emergency Medicine

## 2023-11-27 DIAGNOSIS — R102 Pelvic and perineal pain: Secondary | ICD-10-CM | POA: Diagnosis present

## 2023-11-27 DIAGNOSIS — K5900 Constipation, unspecified: Secondary | ICD-10-CM | POA: Insufficient documentation

## 2023-11-27 MED ORDER — LIDOCAINE VISCOUS HCL 2 % MT SOLN: OROMUCOSAL | 0 refills | Status: DC

## 2023-11-27 MED ORDER — POLYETHYLENE GLYCOL 3350 17 G PO PACK: 17.0000 g | PACK | Freq: Every day | ORAL | 0 refills | Status: DC

## 2023-11-27 NOTE — ED Notes (Signed)
 Discharge papers prepared and discharged by [facility].

## 2023-11-27 NOTE — Discharge Instructions (Addendum)
 As discussed, exam today was reassuring but limited.  The stitches that were able to be observed appeared to be intact.  I would not be surprised if part of the repaired wound slightly tore due to your symptoms as well as mechanism.  Will send you home with lidocaine  solution to place on the area of discomfort to help with pain.  You may take Tylenol  in addition to this.  Regarding your bowel movements, will place you on MiraLAX.  Begin with 2 to 4 packets and increase as needed if you still feel constipated or decrease if you develop diarrhea.  Recommend also beginning a daily fiber supplement; you may find this over-the-counter at your local pharmacy.  Recommend close follow-up with your OB/GYN in the outpatient setting for reassessment of your symptoms.  Please do not hesitate to return if the worrisome signs and symptoms we discussed become apparent.

## 2023-11-27 NOTE — ED Triage Notes (Signed)
 Pt states concern for stitches in vaginal canal ripping after giving birth 2 weeks ago  Denies bleeding, states sharp stinging pain  Has been taking stool softeners but has been having to strain and felt pop 2 days go    Pt is breastfeeding

## 2023-11-27 NOTE — ED Provider Notes (Signed)
 [facility] Provider Note   CSN: [id number] Arrival date & time: 11/27/23  1318     History  Chief Complaint  Patient presents with   Post Birth Vaginal pain     Anne Ritter is a 23 y.o. female.  HPI   23 year old female presents emergency department with complaints of vaginal irritation.  States that she gave birth vaginally 2 weeks ago where she states that she had her vaginal canal repaired on both sides due to tearing experience.  States that she has had feelings of more constipation ever since giving birth.  Has tried oral stool softeners without improvement.  States that 2 days ago, was straining excessively due to feeling constipated and noticed tearing sensation.  Has reported stinging sensation ever since then.  Denies any bleeding.  Past medical history significant for gestational diabetes mellitus, GERD  Home Medications Prior to Admission medications   Medication Sig Start Date End Date Taking? Authorizing Provider  lidocaine  (XYLOCAINE ) 2 % solution You may use 5 to 10 mL topically in area of discomfort vaginally as needed for pain. 11/27/23  Yes Neil Balls A, PA  polyethylene glycol (MIRALAX) 17 g packet Take 17 g by mouth daily. 11/27/23  Yes Neil Balls A, PA  acetaminophen  (TYLENOL ) 325 MG tablet Take 2 tablets (650 mg total) by mouth every 4 (four) hours as needed (for pain scale < 4). 11/13/23   Leveque, Alyssa, MD  benzocaine -Menthol  (DERMOPLAST) 20-0.5 % AERO Apply 1 Application topically as needed for irritation (perineal discomfort). 11/13/23   Leveque, Alyssa, MD  coconut oil OIL Apply 1 Application topically as needed. 11/13/23   Leveque, Alyssa, MD  Elastic Bandages & Supports (ABDOMINAL BINDER/ELASTIC MED) MISC 1 Units by Does not apply route as needed. 11/13/23   Stinson, Jacob J, DO  ibuprofen  (ADVIL ) 800 MG tablet Take 1 tablet (800 mg total) by mouth every 8 (eight) hours. 11/13/23   Leveque, Alyssa, MD   Prenatal Vit-Fe Fumarate-FA (MULTIVITAMIN-PRENATAL) 27-0.8 MG TABS tablet Take 1 tablet by mouth daily at 12 noon.    [provider]  witch hazel-glycerin  (TUCKS) pad Apply 1 Application topically as needed for hemorrhoids. 11/13/23   Leveque, Alyssa, MD      Allergies    Patient has no known allergies.    Review of Systems   Review of Systems  All other systems reviewed and are negative.   Physical Exam Updated Vital Signs BP 116/80 (BP Location: Left Arm)   Pulse (!) 111   Temp 98.5 F (36.9 C)   Resp 18   Ht 5\' 3"  (1.6 m)   Wt 69.8 kg   LMP 02/10/2023   SpO2 98%   BMI 27.26 kg/m  Physical Exam Vitals and nursing note reviewed. Exam conducted with a chaperone present.  Constitutional:      General: She is not in acute distress.    Appearance: She is well-developed.  HENT:     Head: Normocephalic and atraumatic.  Eyes:     Conjunctiva/sclera: Conjunctivae normal.  Cardiovascular:     Rate and Rhythm: Normal rate and regular rhythm.     Heart sounds: No murmur heard. Pulmonary:     Effort: Pulmonary effort is normal. No respiratory distress.     Breath sounds: Normal breath sounds.  Abdominal:     Palpations: Abdomen is soft.     Tenderness: There is no abdominal tenderness.  Genitourinary:    Comments: GU exam performed with female nursing  staff at bedside.  Sutures appreciated from external vaginal canal.  Irritated touch.  No obvious wound dehiscence or active bleeding.  Patient declined speculum exam or manual exam. Musculoskeletal:        General: No swelling.     Cervical back: Neck supple.  Skin:    General: Skin is warm and dry.     Capillary Refill: Capillary refill takes less than 2 seconds.  Neurological:     Mental Status: She is alert.  Psychiatric:        Mood and Affect: Mood normal.     ED Results / Procedures / Treatments   Labs (all labs ordered are listed, but only abnormal results are displayed) Labs Reviewed - No data to  display  EKG None  Radiology No results found.  Procedures Procedures    Medications Ordered in ED Medications - No data to display  ED Course/ Medical Decision Making/ A&P                                 Medical Decision Making Risk OTC drugs. Prescription drug management.   This patient presents to the ED for concern of vaginal pain, this involves an extensive number of treatment options, and is a complaint that carries with it a high risk of complications and morbidity.  The differential diagnosis includes cellulitis, abscess, wound dehiscence, other   Co morbidities that complicate the patient evaluation  See HPI   Additional history obtained:  Additional history obtained from EMR External records from outside source obtained and reviewed including hospital records   Lab Tests:  N/a   Imaging Studies ordered:  N/a   Cardiac Monitoring: / EKG:  N/a   Consultations Obtained:  N/a   Problem List / ED Course / Critical interventions / Medication management  Vaginal pain, constipation Reevaluation of the patient showed that the patient stayed the same I have reviewed the patients home medicines and have made adjustments as needed   Social Determinants of Health:  Denies tobacco, licit drug use.   Test / Admission - Considered:  Vaginal pain, constipation Vitals signs  within normal range and stable throughout visit. 23 year old female presents emergency department with complaints of vaginal irritation.  States that she gave birth vaginally 2 weeks ago where she states that she had her vaginal canal repaired on both sides due to tearing experience.  States that she has had feelings of more constipation ever since giving birth.  Has tried oral stool softeners without improvement.  States that 2 days ago, was straining excessively due to feeling constipated and noticed tearing sensation.  Has reported stinging sensation ever since then.  Denies  any bleeding. On exam, no abdominal tenderness.  Sutures in place vaginal canal as above with overlying tenderness.  No obvious erythema, fluctuance or induration present.  No evidence of gross wound dehiscence.  Unable to visualize entirety in the area due to patient declining speculum exam or manual exam.  Will trial topical numbing agent as well as MiraLAX titrated to effect for patient's constipation.  Recommend close follow-up with imaging in the outpatient setting for reevaluation.  Treatment plan discussed with patient and she acknowledged understanding was agreeable to said plan.  Patient will well-appearing, afebrile in no acute distress. Worrisome signs and symptoms were discussed with the patient, and the patient acknowledged understanding to return to the ED if noticed. Patient was stable upon discharge.  Final Clinical Impression(s) / ED Diagnoses Final diagnoses:  Constipation, unspecified constipation type  Vaginal pain    Rx / DC Orders ED Discharge Orders          Ordered    lidocaine  (XYLOCAINE ) 2 % solution        11/27/23 1448    polyethylene glycol (MIRALAX) 17 g packet  Daily        11/27/23 1448              [place], [place] 11/27/23 1511    Rosealee Concha, MD 11/27/23 1551

## 2023-11-27 NOTE — ED Notes (Signed)
 Pelvic done by Arlester Ladd PA

## 2023-11-28 ENCOUNTER — Telehealth: Payer: Self-pay

## 2023-11-28 NOTE — Progress Notes (Unsigned)
 Error

## 2023-11-28 NOTE — Telephone Encounter (Signed)
 Patient called stating she is having vaginal pain. She states she was seen in the ED yesterday and was given lidocaine  but it has not provided any relief. Advised patient to try ibuprofen  and cool sitz baths and if if the pain does not improve, she should go to MAU at [address] [facility] Entrance C to be seen. Understanding was voiced. Mamta Rimmer l Ryott Rafferty, CMA

## 2023-11-29 ENCOUNTER — Inpatient Hospital Stay (HOSPITAL_COMMUNITY)
Admission: AD | Admit: 2023-11-29 | Discharge: 2023-11-29 | Disposition: A | Discharge status: Home / Self Care | Attending: Obstetrics & Gynecology | Admitting: Obstetrics & Gynecology

## 2023-11-29 ENCOUNTER — Encounter (HOSPITAL_COMMUNITY): Payer: Self-pay | Admitting: Obstetrics & Gynecology

## 2023-11-29 DIAGNOSIS — R52 Pain, unspecified: Secondary | ICD-10-CM | POA: Diagnosis not present

## 2023-11-29 DIAGNOSIS — O9963 Diseases of the digestive system complicating the puerperium: Secondary | ICD-10-CM | POA: Diagnosis present

## 2023-11-29 DIAGNOSIS — K5900 Constipation, unspecified: Secondary | ICD-10-CM | POA: Insufficient documentation

## 2023-11-29 DIAGNOSIS — K6289 Other specified diseases of anus and rectum: Secondary | ICD-10-CM | POA: Diagnosis present

## 2023-11-29 MED ORDER — BISACODYL 5 MG PO TBEC: 5.0000 mg | DELAYED_RELEASE_TABLET | Freq: Two times a day (BID) | ORAL | 0 refills | Status: DC

## 2023-11-29 NOTE — MAU Note (Signed)
 Anne Ritter is a 23 y.o. here in MAU reporting: vag delivered 4/27.  Having constipation and pain with her stitches.  Has been seen for this 5/13.  Taking the Miralax, once each night.  Had a BM this morning, was quite uncomfortable.   Feels like there are stitches near her rectum that are very tight.   Onset of complaint: ongoing Pain score: 5 Vitals:   11/29/23 1437  BP: 126/75  Pulse: (!) 118  Resp: 16  Temp: 98.5 F (36.9 C)  SpO2: 100%      Lab orders placed from triage:

## 2023-11-29 NOTE — MAU Provider Note (Signed)
 Chief Complaint:  Rectal Pain and Constipation   HPI   Postpartum NSVD on 11/11/23    Anne Ritter is a 23 y.o. G1P1001 who is PP from SVD on 11/11/23 complicated by a second degree laceration repaired without difficulty  to maternity admissions reporting rectal pain and constipation. Patient was recently seen in ED on 11/27/23 for similar complaints and was given Miralax and topical lidocaine  for discomfort associated with her sutures. Patient reported she had a BM this morning which was very uncomfortable and she is concerned about her sutures.    Pregnancy Course: [facility]   Past Medical History:  Diagnosis Date   GERD (gastroesophageal reflux disease)    Gestational diabetes    OB History  Gravida Para Term Preterm AB Living  1 1 1   1   SAB IAB Ectopic Multiple Live Births     0 1    # Outcome Date GA Lbr Len/2nd Weight Sex Type Anes PTL Lv  1 Term 11/11/23 [redacted]w[redacted]d 07:18 / 00:57 2860 g M Vag-Spont None  LIV   Past Surgical History:  Procedure Laterality Date   NO PAST SURGERIES     No family history on file. Social History   Tobacco Use   Smoking status: Never   Smokeless tobacco: Never  Vaping Use   Vaping status: Never Used  Substance Use Topics   Alcohol use: No   Drug use: Never   No Known Allergies No medications prior to admission.    I have reviewed patient's Past Medical Hx, Surgical Hx, Family Hx, Social Hx, medications and allergies.   ROS  Pertinent items noted in HPI and remainder of comprehensive ROS otherwise negative.   PHYSICAL EXAM   Patient Vitals for the past 24 hrs:  BP Temp Temp src Pulse Resp SpO2 Height Weight  11/29/23 1452 122/78 -- -- (!) 115 16 100 % -- --  11/29/23 1437 126/75 98.5 F (36.9 C) Oral (!) 118 16 100 % 5\' 3"  (1.6 m) 59.7 kg    Constitutional: Well-developed, well-nourished female in no acute distress.  Cardiovascular: normal rate & rhythm, warm and well-perfused Respiratory: normal effort, no problems  with respiration noted GI: Abd soft, non-tender, MS: Extremities nontender, no edema, normal ROM Neurologic: Alert and oriented x 4.  GU: no CVA tenderness Pelvic Exam chaperoned by Ronda Cocks RN  Sutures in place in the vaginal canal with no evidence of dehiscence, erythema, or induration although  slightly tender to touch on exam. Topical petroleum jelly placed over one area of suture that has not yet dissolved to soften the area that  may be causing the irration ( Culture obtained prior to r/o infection) although low suspicion       Labs: Results for orders placed or performed during the hospital encounter of 11/29/23 (from the past 24 hours)  Aerobic Culture w Gram Stain (superficial specimen)     Status: None (Preliminary result)   Collection Time: 11/29/23  4:10 PM   Specimen: Vaginal; Wound  Result Value Ref Range   Specimen Description VAGINA    Special Requests SUTURE    Gram Stain      RARE WBC PRESENT, PREDOMINANTLY PMN RARE GRAM POSITIVE COCCI Performed at [facility], [address], [place], [place] [address]    Culture PENDING    Report Status PENDING     Imaging:  No results found.  MDM & MAU COURSE  MDM:  -Postpartum vaginal pain on suture line  -  2nd degree laceration healing (Wound cx obtained to r/o infection) although low suspicion since no evidence of wound dehiscence or purulent discharge from the sutures noted on exam with minimal tenderness  -Constipation Postpartum with second degree laceration - Plan to discharge home on bowel regimen  and reinforce peri care  -Anticipatory guidance regarding care of perineal laceration and avoid anything inside the vagina until cleared at postpartum visit   MAU Course: Orders Placed This Encounter  Procedures   Aerobic Culture w Gram Stain (superficial specimen)   Discharge patient Discharge disposition: 01-Home or Self Care; Discharge patient date: 11/29/2023   Discharge patient Discharge disposition:  01-Home or Self Care; Discharge patient date: 11/29/2023   Meds ordered this encounter  Medications   bisacodyl (DULCOLAX) 5 MG EC tablet    Sig: Take 1 tablet (5 mg total) by mouth 2 (two) times daily.    Dispense:  14 tablet    Refill:  0    Supervising Provider:   PRATT, TANYA S [[id number]]    I have reviewed the patient chart and performed the physical exam . Medications ordered as stated below.  A/P as described below.  Counseling and education provided and patient agreeable  with plan as described below. Verbalized understanding.    ASSESSMENT   1. Pain related to vaginal delivery   2. Postpartum state   3. Perineal laceration during delivery, delivered     PLAN  Discharge home in stable condition with return precautions.   See AVS for full description of information given to the patient including both verbal and written. Patient verbalized understanding and agrees with the plan as described above.     Follow-up Information     [facility] Follow up.   Specialty: Obstetrics and Gynecology Why: If symptoms worsen or fail to resolve, As scheduled for postpartum follow-up Contact information: [address] [facility] [place]  [address] [phone]                Allergies as of 11/29/2023   No Known Allergies      Medication List     TAKE these medications    Abdominal Binder/Elastic Med Misc 1 Units by Does not apply route as needed.   acetaminophen  325 MG tablet Commonly known as: Tylenol  Take 2 tablets (650 mg total) by mouth every 4 (four) hours as needed (for pain scale < 4).   benzocaine -Menthol  20-0.5 % Aero Commonly known as: DERMOPLAST Apply 1 Application topically as needed for irritation (perineal discomfort).   bisacodyl 5 MG EC tablet Commonly known as: DULCOLAX Take 1 tablet (5 mg total) by mouth 2 (two) times daily.   coconut oil Oil Apply 1 Application topically as  needed.   ibuprofen  800 MG tablet Commonly known as: ADVIL  Take 1 tablet (800 mg total) by mouth every 8 (eight) hours.   lidocaine  2 % solution Commonly known as: XYLOCAINE  You may use 5 to 10 mL topically in area of discomfort vaginally as needed for pain.   multivitamin-prenatal 27-0.8 MG Tabs tablet Take 1 tablet by mouth daily at 12 noon.   polyethylene glycol 17 g packet Commonly known as: MiraLax Take 17 g by mouth daily.   witch hazel-glycerin  pad Commonly known as: TUCKS Apply 1 Application topically as needed for hemorrhoids.        Debbe Fail, MSN, [facility], [facility] [employer]

## 2023-12-02 LAB — AEROBIC CULTURE W GRAM STAIN (SUPERFICIAL SPECIMEN)

## 2023-12-13 ENCOUNTER — Ambulatory Visit: Admitting: Family Medicine

## 2023-12-13 ENCOUNTER — Ambulatory Visit (INDEPENDENT_AMBULATORY_CARE_PROVIDER_SITE_OTHER): Admitting: Family Medicine

## 2023-12-13 VITALS — BP 101/81 | HR 70 | Ht 64.0 in | Wt 130.0 lb

## 2023-12-13 DIAGNOSIS — Z8632 Personal history of gestational diabetes: Secondary | ICD-10-CM

## 2023-12-13 NOTE — Progress Notes (Signed)
    Post Partum Visit Note  Anne Ritter is a 23 y.o. G14P1001 female who presents for a postpartum visit. She is 4 weeks postpartum following a normal spontaneous vaginal delivery.  I have fully reviewed the prenatal and intrapartum course. The delivery was at 39.1 gestational weeks.  Anesthesia: none. Postpartum course has been good. Baby is doing well. Baby is feeding by breast. Bleeding no bleeding. Bowel function is normal. Bladder function is normal. Patient is not sexually active. Contraception method is condoms. Postpartum depression screening: negative.   The pregnancy intention screening data noted above was reviewed. Potential methods of contraception were discussed. The patient elected to proceed with No data recorded.    Health Maintenance Due  Topic Date Due   HPV VACCINES (1 - 3-dose series) Never done   Meningococcal B Vaccine (1 of 2 - Standard) Never done   DTaP/Tdap/Td (1 - Tdap) Never done   Cervical Cancer Screening (Pap smear)  Never done   COVID-19 Vaccine (1 - 2024-25 season) Never done    The following portions of the patient's history were reviewed and updated as appropriate: allergies, current medications, past family history, past medical history, past social history, past surgical history, and problem list.  Review of Systems Pertinent items are noted in HPI.  Objective:  There were no vitals taken for this visit.   General:  alert, cooperative, and no distress   Breasts:  not indicated  Lungs: clear to auscultation bilaterally  Heart:  regular rate and rhythm, S1, S2 normal, no murmur, click, rub or gallop  Abdomen: soft, non-tender; bowel sounds normal; no masses,  no organomegaly   Wound N/a  GU exam:  not indicated       Assessment:   1. Postpartum exam Going to [place] next week - her husband still lives there  2. History of gestational diabetes (Primary) - Glucose tolerance, 2 hours    Plan:   Essential components of care per ACOG  recommendations:  1.  Mood and well being: Patient with negative depression screening today. Reviewed local resources for support.  - Patient tobacco use? No.   - hx of drug use? No.    2. Infant care and feeding:  -Patient currently breastmilk feeding? Yes. Reviewed importance of draining breast regularly to support lactation.  -Social determinants of health (SDOH) reviewed in EPIC. No concerns  3. Sexuality, contraception and birth spacing - Patient does not want a pregnancy in the next year.  - Reviewed reproductive life planning. Reviewed contraceptive methods based on pt preferences and effectiveness.  Patient desired Female Condom today.   - Discussed birth spacing of 18 months  4. Sleep and fatigue -Encouraged family/partner/community support of 4 hrs of uninterrupted sleep to help with mood and fatigue  5. Physical Recovery  - Discussed patients delivery and complications. She describes her labor as good. - Patient had a Vaginal, no problems at delivery. Patient had a 2nd degree laceration. Perineal healing reviewed. Patient expressed understanding - Patient has urinary incontinence? No. - Patient is safe to resume physical and sexual activity  6.  Health Maintenance - HM due items addressed Yes - Last pap smear No results found for: "DIAGPAP" Pap smear not done at today's visit. Had PAP at HD at beginning of pregnancy -Breast Cancer screening indicated? No.   7. Chronic Disease/Pregnancy Condition follow up: Gestational Diabetes  - PCP follow up  Arrie Lares, [employer] for [employer], [facility]

## 2023-12-14 ENCOUNTER — Ambulatory Visit: Payer: Self-pay | Admitting: Family Medicine

## 2023-12-14 LAB — GLUCOSE TOLERANCE, 2 HOURS
Glucose, 2 hour: 109 mg/dL (ref 70–139)
Glucose, GTT - Fasting: 78 mg/dL (ref 70–99)

## 2024-08-18 ENCOUNTER — Encounter: Payer: Self-pay | Admitting: Family Medicine

## 2024-09-23 ENCOUNTER — Ambulatory Visit
